# Patient Record
Sex: Male | Born: 1970
Health system: Southern US, Community
[De-identification: ages and names within clinical notes are randomized; demographics above are authoritative.]

## PROBLEM LIST (undated history)

## (undated) DIAGNOSIS — F329 Major depressive disorder, single episode, unspecified: Secondary | ICD-10-CM

## (undated) DIAGNOSIS — E785 Hyperlipidemia, unspecified: Secondary | ICD-10-CM

## (undated) DIAGNOSIS — I10 Essential (primary) hypertension: Secondary | ICD-10-CM

## (undated) DIAGNOSIS — Z8669 Personal history of other diseases of the nervous system and sense organs: Secondary | ICD-10-CM

## (undated) DIAGNOSIS — F32A Depression, unspecified: Secondary | ICD-10-CM

## (undated) DIAGNOSIS — F419 Anxiety disorder, unspecified: Secondary | ICD-10-CM

## (undated) DIAGNOSIS — K219 Gastro-esophageal reflux disease without esophagitis: Secondary | ICD-10-CM

## (undated) HISTORY — DX: Essential (primary) hypertension: I10

## (undated) HISTORY — DX: Personal history of other diseases of the nervous system and sense organs: Z86.69

## (undated) HISTORY — DX: Hyperlipidemia, unspecified: E78.5

## (undated) HISTORY — PX: ELBOW SURGERY: SHX618

## (undated) HISTORY — DX: Major depressive disorder, single episode, unspecified: F32.9

## (undated) HISTORY — DX: Depression, unspecified: F32.A

## (undated) HISTORY — DX: Gastro-esophageal reflux disease without esophagitis: K21.9

---

## 2011-04-26 DIAGNOSIS — K219 Gastro-esophageal reflux disease without esophagitis: Secondary | ICD-10-CM | POA: Insufficient documentation

## 2011-05-11 DIAGNOSIS — I10 Essential (primary) hypertension: Secondary | ICD-10-CM | POA: Insufficient documentation

## 2011-05-11 DIAGNOSIS — Z0000 Encounter for general adult medical examination without abnormal findings: Secondary | ICD-10-CM | POA: Insufficient documentation

## 2013-06-06 ENCOUNTER — Ambulatory Visit (INDEPENDENT_AMBULATORY_CARE_PROVIDER_SITE_OTHER): Payer: BC Managed Care – PPO | Admitting: Family

## 2013-06-06 ENCOUNTER — Encounter: Payer: Self-pay | Admitting: Family

## 2013-06-06 VITALS — BP 148/90 | HR 74 | Temp 98.4°F | Resp 16 | Ht 71.0 in | Wt 283.0 lb

## 2013-06-06 DIAGNOSIS — R635 Abnormal weight gain: Secondary | ICD-10-CM

## 2013-06-06 DIAGNOSIS — Z23 Encounter for immunization: Secondary | ICD-10-CM

## 2013-06-06 DIAGNOSIS — R21 Rash and other nonspecific skin eruption: Secondary | ICD-10-CM

## 2013-06-06 DIAGNOSIS — E785 Hyperlipidemia, unspecified: Secondary | ICD-10-CM

## 2013-06-06 DIAGNOSIS — I10 Essential (primary) hypertension: Secondary | ICD-10-CM

## 2013-06-06 DIAGNOSIS — L709 Acne, unspecified: Secondary | ICD-10-CM | POA: Insufficient documentation

## 2013-06-06 LAB — LIPID PANEL
HDL: 57 mg/dL (ref >39)
LDL Cholesterol: 125 mg/dL — ABNORMAL HIGH (ref 0–99)
Total CHOL/HDL Ratio: 3.5 Ratio
Triglycerides: 93 mg/dL (ref <150)

## 2013-06-06 LAB — BASIC METABOLIC PANEL WITH GFR
CO2: 25 mEq/L (ref 19–32)
Chloride: 103 mEq/L (ref 96–112)
Creat: 1.07 mg/dL (ref 0.50–1.35)
GFR, Est Non African American: 86 mL/min
Sodium: 138 mEq/L (ref 135–145)

## 2013-06-06 LAB — HEPATIC FUNCTION PANEL
AST: 29 U/L (ref 0–37)
Albumin: 4.6 g/dL (ref 3.5–5.2)
Bilirubin, Direct: 0.2 mg/dL (ref 0.0–0.3)
Indirect Bilirubin: 0.6 mg/dL (ref 0.0–0.9)
Total Bilirubin: 0.8 mg/dL (ref 0.3–1.2)
Total Protein: 7.3 g/dL (ref 6.0–8.3)

## 2013-06-06 LAB — TSH: TSH: 1.534 u[IU]/mL (ref 0.350–4.500)

## 2013-06-06 MED ORDER — AMLODIPINE BESYLATE 5 MG PO TABS: 5.0000 mg | ORAL_TABLET | Freq: Every day | ORAL | Status: DC

## 2013-06-06 NOTE — Assessment & Plan Note (Signed)
Will obtain flp/lft

## 2013-06-06 NOTE — Progress Notes (Signed)
Pre visit review using our clinic review tool, if applicable. No additional management support is needed unless otherwise documented below in the visit note. 

## 2013-06-06 NOTE — Assessment & Plan Note (Signed)
Obtain TSH, discussed healthy diet, exercise, weight loss.  Flu shot today.

## 2013-06-06 NOTE — Assessment & Plan Note (Signed)
Suspect rosacea, will refer to derm for management.

## 2013-06-06 NOTE — Patient Instructions (Signed)
Please complete lab work prior to leaving. Start amlodipine. Follow up in 1 month for blood pressure check. Start counting calories using my fitness pal with goal weight loss of 1-2 pounds a week. Try to walk 30 minutes 5 days a week. Work up slowly. Welcome to [facility]!

## 2013-06-06 NOTE — Progress Notes (Signed)
Subjective:    Patient ID: Chad Mejia, male    DOB: 1971/06/08, 42 y.o.   MRN: [id number]  HPI  Chad Mejia is a 42 yr old male who presents today to establish care.  HTN- had been on zestoretic but experienced ED.    Hypelipidemia- reports that he was told that cholesterol is elevated.  Migraines- reports he had them 1-2 times a year in his 20's, but has not had one in 10 years.  Reports some weight gain.  60 pounds in last 1.5 yrs. Reports more sweets in the last year.  Too much fast food, eats late.  Rare soda.     Review of Systems  Constitutional: Positive for unexpected weight change.  HENT: Negative for rhinorrhea.   Respiratory: Negative for cough and shortness of breath.   Cardiovascular: Negative for chest pain and leg swelling.  Gastrointestinal: Negative for diarrhea and constipation.       Occasional nausea with stress  Genitourinary: Negative for dysuria and frequency.  Musculoskeletal: Negative for arthralgias and myalgias.  Skin: Negative for rash.       Reports some facial rash.    Neurological: Negative for headaches.  Hematological: Negative for adenopathy.  Psychiatric/Behavioral:       Reports mild anxiety, no current depression   Past Medical History  Diagnosis Date  . Hyperlipidemia   . Hypertension   . History of migraine     History   Social History  . Marital Status: Single    Spouse Name: N/A    Number of Children: N/A  . Years of Education: N/A   Occupational History  . Not on file.   Social History Main Topics  . Smoking status: Never Smoker   . Smokeless tobacco: Never Used  . Alcohol Use: Yes     Comment: 1 beer a month  . Drug Use: Not on file  . Sexual Activity: Not on file   Other Topics Concern  . 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     Single, no children   Lives with friend   Has a cat   Enjoys painting,writing, reading   Bachelors degree    Past  Surgical History  Procedure Laterality Date  . Elbow surgery Left     "shattered elbow as a child"    Family History  Problem Relation Age of Onset  . Hyperlipidemia Mother   . Hypertension Mother   . Schizophrenia Maternal Aunt   . Arthritis Maternal Grandmother     No Known Allergies  No current outpatient prescriptions on file prior to visit.   No current facility-administered medications on file prior to visit.    BP 148/90  Pulse 74  Temp(Src) 98.4 F (36.9 C) (Oral)  Resp 16  Ht 5\' 11"  (1.803 m)  Wt 283 lb (128.368 kg)  BMI 39.49 kg/m2  SpO2 99%       Objective:   Physical Exam  Constitutional: He is oriented to person, place, and time. He appears well-developed.  Obese white male, pleasant, NAD  HENT:  Head: Normocephalic and atraumatic.  Cardiovascular: Normal rate and regular rhythm.   No murmur heard. Pulmonary/Chest: Effort normal and breath sounds normal. No respiratory distress. He has no wheezes. He has no rales. He exhibits no tenderness.  Abdominal: Soft. Bowel sounds are normal. He exhibits no distension. There is no tenderness. There is no rebound and no guarding.  Lymphadenopathy:  He has no cervical adenopathy.  Neurological: He is alert and oriented to person, place, and time.  Skin: Skin is warm and dry.  + erythematous rash noted on face  Psychiatric: He has a normal mood and affect. His behavior is normal. Judgment and thought content normal.          Assessment & Plan:

## 2013-06-06 NOTE — Assessment & Plan Note (Signed)
Start amlodipine

## 2013-06-07 ENCOUNTER — Encounter: Payer: Self-pay | Admitting: Family

## 2013-07-10 ENCOUNTER — Ambulatory Visit (INDEPENDENT_AMBULATORY_CARE_PROVIDER_SITE_OTHER): Payer: BC Managed Care – PPO | Admitting: Family

## 2013-07-10 ENCOUNTER — Encounter: Payer: Self-pay | Admitting: Family

## 2013-07-10 VITALS — BP 142/90 | HR 60 | Temp 97.8°F | Resp 16 | Ht 71.0 in | Wt 286.0 lb

## 2013-07-10 DIAGNOSIS — I10 Essential (primary) hypertension: Secondary | ICD-10-CM

## 2013-07-10 DIAGNOSIS — R635 Abnormal weight gain: Secondary | ICD-10-CM

## 2013-07-10 MED ORDER — AMLODIPINE BESYLATE 10 MG PO TABS: 10.0000 mg | ORAL_TABLET | Freq: Every day | ORAL | Status: DC

## 2013-07-10 NOTE — Patient Instructions (Signed)
Start taking 2 tablets by mouth daily, then fill Norvasc 10mg  1 tablet daily. Follow up in one month. Start checking Blood Pressure daily at home.

## 2013-07-10 NOTE — Assessment & Plan Note (Signed)
Discussed healthy diet and exercise

## 2013-07-10 NOTE — Progress Notes (Signed)
Pre visit review using our clinic review tool, if applicable. No additional management support is needed unless otherwise documented below in the visit note. 

## 2013-07-10 NOTE — Assessment & Plan Note (Addendum)
Uncontrolled. Increase Amlodipine to 10mg  daily due continued elevated BP. Follow up in one month. Discussed importance of diet and exercise.  I have personally seen and examined patient and agree with Jerrel Ivory NP student's assessment and plan.

## 2013-07-10 NOTE — Progress Notes (Signed)
   Subjective:    Patient ID: Chad Mejia, male    DOB: 04-17-1971, 43 y.o.   MRN: [id number]  Hypertension Pertinent negatives include no chest pain, headaches or shortness of breath.   Chad Mejia is a 43 year old male who presents today for follow up of hypertension.  Patient was started on Amlodipine 5mg  during last visit and BP today was 142/90.  Patient reports taking medication daily, denies chest pain, lower extremity edema, and shortness of breath. Patient reports checking BP during the week and running 130's/90's.  Patient reports he's starting to work on diet and exercise.   Review of Systems  Constitutional: Negative for chills.  HENT: Negative for rhinorrhea.   Respiratory: Negative for cough and shortness of breath.   Cardiovascular: Negative for chest pain and leg swelling.  Genitourinary: Negative for difficulty urinating.  Neurological: Negative for dizziness and headaches.   Past Medical History  Diagnosis Date  . Hyperlipidemia   . Hypertension   . History of migraine     History   Social History  . Marital Status: Single    Spouse Name: N/A    Number of Children: N/A  . Years of Education: N/A   Occupational History  . Not on file.   Social History Main Topics  . Smoking status: Passive Smoke Exposure - Never Smoker  . Smokeless tobacco: Never Used  . Alcohol Use: Yes     Comment: 1 beer a month  . Drug Use: Not on file  . Sexual Activity: Not on file   Other Topics Concern  . 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     Single, no children   Lives with friend   Has a cat   Enjoys painting,writing, reading   Bachelors degree    Past Surgical History  Procedure Laterality Date  . Elbow surgery Left     "shattered elbow as a child"    Family History  Problem Relation Age of Onset  . Hyperlipidemia Mother   . Hypertension Mother   . Schizophrenia Maternal Aunt   . Arthritis Maternal  Grandmother     No Known Allergies  No current outpatient prescriptions on file prior to visit.   No current facility-administered medications on file prior to visit.    BP 142/90  Pulse 60  Temp(Src) 97.8 F (36.6 C) (Oral)  Resp 16  Ht 5\' 11"  (1.803 m)  Wt 286 lb (129.729 kg)  BMI 39.91 kg/m2  SpO2 99%        Objective:   Physical Exam  Constitutional: He is oriented to person, place, and time. He appears well-nourished.  HENT:  Head: Normocephalic.  Cardiovascular: Normal rate, regular rhythm and normal heart sounds.   Pulmonary/Chest: Breath sounds normal. No respiratory distress. He has no wheezes.  Neurological: He is alert and oriented to person, place, and time.  Skin: Skin is warm and dry.  Psychiatric: He has a normal mood and affect.          Assessment & Plan:

## 2013-07-19 ENCOUNTER — Telehealth: Payer: Self-pay | Admitting: Family

## 2013-07-19 NOTE — Telephone Encounter (Signed)
Relevant patient education mailed to patient.  

## 2013-08-07 ENCOUNTER — Ambulatory Visit (INDEPENDENT_AMBULATORY_CARE_PROVIDER_SITE_OTHER): Payer: BC Managed Care – PPO | Admitting: Family

## 2013-08-07 ENCOUNTER — Encounter: Payer: Self-pay | Admitting: Family

## 2013-08-07 VITALS — BP 138/90 | HR 64 | Temp 98.7°F | Resp 16 | Ht 71.0 in | Wt 280.1 lb

## 2013-08-07 DIAGNOSIS — I10 Essential (primary) hypertension: Secondary | ICD-10-CM

## 2013-08-07 MED ORDER — LISINOPRIL 10 MG PO TABS
10.0000 mg | ORAL_TABLET | Freq: Every day | ORAL | Status: DC
Start: 2013-08-07 — End: 2013-09-04

## 2013-08-07 NOTE — Progress Notes (Signed)
Subjective:    Patient ID: Chad Mejia, male    DOB: 12-30-1970, 43 y.o.   MRN: [id number]  Hypertension Associated symptoms include headaches. Pertinent negatives include no chest pain, palpitations or shortness of breath.   Chad Mejia is a 43 year old male who presents today for follow up of hypertension. Patient was started on Amlodipine 5mg  for hypertension in December 2014 which was increased to10mg  daily in January. Patient reports taking his blood pressure at home and has been running 025'K to 270'W systolic and 23'J to 62'G diastolic. More than 50% of his DBP's at home have been >90.  Patient denies regular exercise and healthy diet at this point. Denies chest pain, shortness of breath, palpitations.   BP Readings from Last 3 Encounters:  08/07/13 138/90  07/10/13 142/90  06/06/13 148/90     Review of Systems  Constitutional: Negative for chills.  HENT: Negative for sore throat.   Respiratory: Negative for cough, chest tightness and shortness of breath.   Cardiovascular: Negative for chest pain, palpitations and leg swelling.  Neurological: Positive for headaches. Negative for dizziness.       Past Medical History  Diagnosis Date  . Hyperlipidemia   . Hypertension   . History of migraine     History   Social History  . Marital Status: Single    Spouse Name: N/A    Number of Children: N/A  . Years of Education: N/A   Occupational History  . Not on file.   Social History Main Topics  . Smoking status: Passive Smoke Exposure - Never Smoker  . Smokeless tobacco: Never Used  . Alcohol Use: Yes     Comment: 1 beer a month  . Drug Use: Not on file  . Sexual Activity: Not on file   Other Topics Concern  . 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     Single, no children   Lives with friend   Has a cat   Enjoys painting,writing, reading   Bachelors degree    Past Surgical History  Procedure Laterality  Date  . Elbow surgery Left     "shattered elbow as a child"    Family History  Problem Relation Age of Onset  . Hyperlipidemia Mother   . Hypertension Mother   . Schizophrenia Maternal Aunt   . Arthritis Maternal Grandmother     No Known Allergies  Current Outpatient Prescriptions on File Prior to Visit  Medication Sig Dispense Refill  . amLODipine (NORVASC) 10 MG tablet Take 1 tablet (10 mg total) by mouth daily.  30 tablet  2   No current facility-administered medications on file prior to visit.    BP 138/90  Pulse 64  Temp(Src) 98.7 F (37.1 C) (Oral)  Resp 16  Ht 5\' 11"  (1.803 m)  Wt 280 lb 1.9 oz (127.062 kg)  BMI 39.09 kg/m2  SpO2 99%    Objective:   Physical Exam  Constitutional: He is oriented to person, place, and time. He appears well-nourished.  HENT:  Head: Normocephalic.  Cardiovascular: Normal rate, regular rhythm, S1 normal, S2 normal and normal heart sounds.   No murmur heard. Pulses:      Radial pulses are 2+ on the right side, and 2+ on the left side.  Pulmonary/Chest: Effort normal and breath sounds normal. He has no wheezes.  Neurological: He is alert and oriented to person, place, and time.  Skin: Skin is warm and  dry.  Psychiatric: He has a normal mood and affect.          Assessment & Plan:  I have personally seen and examined patient and agree with Alma Friendly NP-student's assessment and plan.

## 2013-08-07 NOTE — Patient Instructions (Signed)
Start lisinopril, follow up in 1 month.

## 2013-08-07 NOTE — Assessment & Plan Note (Signed)
Continues to be uncontrolled, start lisinopril 10mg  daily, continue amlodipine 10mg  daily. Follow up in one month. Will obtain BMET then.

## 2013-08-07 NOTE — Progress Notes (Signed)
Pre visit review using our clinic review tool, if applicable. No additional management support is needed unless otherwise documented below in the visit note. 

## 2013-08-08 ENCOUNTER — Telehealth: Payer: Self-pay | Admitting: Family

## 2013-08-08 NOTE — Telephone Encounter (Signed)
Relevant patient education assigned to patient using Emmi. ° °

## 2013-09-04 ENCOUNTER — Ambulatory Visit (INDEPENDENT_AMBULATORY_CARE_PROVIDER_SITE_OTHER): Payer: BC Managed Care – PPO | Admitting: Family

## 2013-09-04 ENCOUNTER — Encounter: Payer: Self-pay | Admitting: Family

## 2013-09-04 VITALS — BP 130/76 | HR 69 | Temp 98.1°F | Resp 16 | Ht 71.0 in | Wt 281.1 lb

## 2013-09-04 DIAGNOSIS — I10 Essential (primary) hypertension: Secondary | ICD-10-CM

## 2013-09-04 LAB — BASIC METABOLIC PANEL
BUN: 13 mg/dL (ref 6–23)
CHLORIDE: 101 meq/L (ref 96–112)
CO2: 28 mEq/L (ref 19–32)
Calcium: 9.5 mg/dL (ref 8.4–10.5)
Creat: 0.97 mg/dL (ref 0.50–1.35)
Glucose, Bld: 98 mg/dL (ref 70–99)
Potassium: 4.5 mEq/L (ref 3.5–5.3)
SODIUM: 136 meq/L (ref 135–145)

## 2013-09-04 MED ORDER — AMLODIPINE BESYLATE 10 MG PO TABS: 10.0000 mg | ORAL_TABLET | Freq: Every day | ORAL | Status: DC

## 2013-09-04 MED ORDER — LISINOPRIL 10 MG PO TABS: 10.0000 mg | ORAL_TABLET | Freq: Every day | ORAL | Status: DC

## 2013-09-04 NOTE — Progress Notes (Signed)
   Subjective:    Patient ID: Chad Mejia, male    DOB: 1970-08-02, 43 y.o.   MRN: [id number]  HPI  Chad Mejia is a 43 yr old male who presents today for follow up of hypertension.    Current medications include amlodipine 10mg  and lisinopril 10 mg. Lisinopril was added to his regimen last visit. Denies cough, sob, swelling or chest pain.  He has been monitoring his blood pressure at home closely and his readings have been well controlled.   BP Readings from Last 3 Encounters:  09/04/13 130/76  08/07/13 138/90  07/10/13 142/90     Review of Systems See HPI  Past Medical History  Diagnosis Date  . Hyperlipidemia   . Hypertension   . History of migraine     History   Social History  . Marital Status: Single    Spouse Name: N/A    Number of Children: N/A  . Years of Education: N/A   Occupational History  . Not on file.   Social History Main Topics  . Smoking status: Passive Smoke Exposure - Never Smoker  . Smokeless tobacco: Never Used  . Alcohol Use: Yes     Comment: 1 beer a month  . Drug Use: Not on file  . Sexual Activity: Not on file   Other Topics Concern  . 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     Single, no children   Lives with friend   Has a cat   Enjoys painting,writing, reading   Bachelors degree    Past Surgical History  Procedure Laterality Date  . Elbow surgery Left     "shattered elbow as a child"    Family History  Problem Relation Age of Onset  . Hyperlipidemia Mother   . Hypertension Mother   . Schizophrenia Maternal Aunt   . Arthritis Maternal Grandmother     No Known Allergies  Current Outpatient Prescriptions on File Prior to Visit  Medication Sig Dispense Refill  . amLODipine (NORVASC) 10 MG tablet Take 1 tablet (10 mg total) by mouth daily.  30 tablet  2  . lisinopril (PRINIVIL,ZESTRIL) 10 MG tablet Take 1 tablet (10 mg total) by mouth daily.  30 tablet  0   No  current facility-administered medications on file prior to visit.    BP 130/76  Pulse 69  Temp(Src) 98.1 F (36.7 C) (Oral)  Resp 16  Ht 5\' 11"  (1.803 m)  Wt 281 lb 1.3 oz (127.497 kg)  BMI 39.22 kg/m2  SpO2 99%       Objective:   Physical Exam  Constitutional: He is oriented to person, place, and time. He appears well-developed and well-nourished. No distress.  HENT:  Head: Normocephalic and atraumatic.  Cardiovascular: Normal rate and regular rhythm.   No murmur heard. Pulmonary/Chest: Effort normal and breath sounds normal. No respiratory distress. He has no wheezes. He has no rales. He exhibits no tenderness.  Musculoskeletal: He exhibits no edema.  Neurological: He is alert and oriented to person, place, and time.  Psychiatric: He has a normal mood and affect. His behavior is normal. Judgment and thought content normal.          Assessment & Plan:

## 2013-09-04 NOTE — Progress Notes (Signed)
Pre visit review using our clinic review tool, if applicable. No additional management support is needed unless otherwise documented below in the visit note. 

## 2013-09-04 NOTE — Patient Instructions (Signed)
Please complete lab work prior to leaving. Follow up in 4 months.  

## 2013-09-04 NOTE — Assessment & Plan Note (Signed)
Improved.  Continue current meds. Check follow up BMET today. He plans to start walking now that the weather is getting nicer.  I have encouraged him to do this. He will continue to monitor his blood pressures at home and will contact me if he develops elevated pressures.  Otherwise, we will see him back in 4 months.

## 2014-01-01 ENCOUNTER — Encounter: Payer: Self-pay | Admitting: Family

## 2014-01-01 ENCOUNTER — Ambulatory Visit (INDEPENDENT_AMBULATORY_CARE_PROVIDER_SITE_OTHER): Payer: BC Managed Care – PPO | Admitting: Family

## 2014-01-01 VITALS — BP 112/78 | HR 60 | Temp 97.9°F | Resp 16 | Ht 71.0 in | Wt 278.1 lb

## 2014-01-01 DIAGNOSIS — R400 Somnolence: Secondary | ICD-10-CM | POA: Insufficient documentation

## 2014-01-01 DIAGNOSIS — G4710 Hypersomnia, unspecified: Secondary | ICD-10-CM

## 2014-01-01 DIAGNOSIS — I10 Essential (primary) hypertension: Secondary | ICD-10-CM

## 2014-01-01 DIAGNOSIS — R404 Transient alteration of awareness: Secondary | ICD-10-CM

## 2014-01-01 MED ORDER — ZOLPIDEM TARTRATE 5 MG PO TABS: 5.0000 mg | ORAL_TABLET | Freq: Every evening | ORAL | Status: DC | PRN

## 2014-01-01 NOTE — Patient Instructions (Addendum)
Please follow up in 4 months. Start [place] on an as needed basis.

## 2014-01-01 NOTE — Progress Notes (Addendum)
Subjective:    Patient ID: Chad Mejia, male    DOB: 06/25/1970, 43 y.o.   MRN: [id number]  HPI Mr. Stefanelli is a 43 yo male here today for fu for HTN. He was started on Amlodipine 5mg  for hypertension in December 2014 which was increased to10mg  daily in January. Lisinopril 10 mg was added February 2015.  HYPERTENSION: Monitors BP at home: yes Blood pressure range/ average : systolic ranges 144-315 and diastolic ranges 40-08. Medication Compliance: yes Exercise: twice weekly walks for 30-60 minutes. Chest pain, palpitations:      no Dyspnea: no Edema: no Lightheadedness,Syncope:no BP Readings from Last 3 Encounters:  01/01/14 112/78  09/04/13 130/76  08/07/13 138/90    Complains of insomnia. He has trouble falling asleep. Sleeps about 4 hours per night. He has had this his whole life. + snoring, + daytime sleepiness.  Non-restorative slep  Review of Systems  Constitutional: Negative for fever, chills and diaphoresis.  Respiratory: Negative for cough.   Cardiovascular: Negative for chest pain and palpitations.  Neurological: Negative for dizziness and light-headedness.   Past Medical History  Diagnosis Date   Hyperlipidemia    Hypertension    History of migraine     History   Social History   Marital Status: Single    Spouse Name: N/A    Number of Children: N/A   Years of Education: N/A   Occupational History   Not on file.   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     Single, no children   Lives with friend   Has a cat   Enjoys painting,writing, reading   Bachelors degree    Past Surgical History  Procedure Laterality Date   Elbow surgery Left       "shattered elbow as a child"    Family History  Problem Relation Age of Onset   Hyperlipidemia Mother    Hypertension Mother    Schizophrenia Maternal Aunt    Arthritis Maternal Grandmother     No Known Allergies  Current Outpatient Prescriptions on File Prior to Visit  Medication Sig Dispense Refill   amLODipine (NORVASC) 10 MG tablet Take 1 tablet (10 mg total) by mouth daily.  90 tablet  1   lisinopril (PRINIVIL,ZESTRIL) 10 MG tablet Take 1 tablet (10 mg total) by mouth daily.  90 tablet  1   No current facility-administered medications on file prior to visit.    BP 112/78   Pulse 60   Temp(Src) 97.9 F (36.6 C) (Oral)   Resp 16   Ht 5\' 11"  (1.803 m)   Wt 278 lb 1.9 oz (126.154 kg)   BMI 38.81 kg/m2       Objective:   Physical Exam  Constitutional: He is oriented to person, place, and time. He appears well-developed and well-nourished. No distress.  HENT:  Head: Normocephalic and atraumatic.  Mouth/Throat: No oropharyngeal exudate.  Eyes: Pupils are equal, round, and reactive to light. No scleral icterus.  Cardiovascular: Normal rate and regular rhythm.  Exam reveals no gallop and no friction rub.   No murmur heard. Pulmonary/Chest: Effort normal and breath sounds  normal. No respiratory distress. He has no wheezes. He has no rales.  Musculoskeletal: He exhibits no edema.  Lymphadenopathy:    He has no cervical adenopathy.  Neurological: He is alert and oriented to person, place, and time.  Skin: Skin is warm and dry. He is not diaphoretic.  Psychiatric: He has a normal mood and affect. His behavior is normal.          Assessment & Plan:  Patient seen by [facility] NP-student.  I have personally seen and examined patient and agree with Ms. Whitmire's assessment and plan.

## 2014-01-01 NOTE — Progress Notes (Signed)
Pre visit review using our clinic review tool, if applicable. No additional management support is needed unless otherwise documented below in the visit note. 

## 2014-01-01 NOTE — Assessment & Plan Note (Signed)
Body mass index is 38.81 kg/(m^2). Given hx and habitus, I am suspicious for OSA. Will refer for sleep study.  I would like to evaluate him for OSA prior to adding a sedating med such as ambien.  He is agreeable.

## 2014-01-01 NOTE — Assessment & Plan Note (Signed)
BP stable on current meds.   

## 2014-02-26 ENCOUNTER — Encounter (HOSPITAL_BASED_OUTPATIENT_CLINIC_OR_DEPARTMENT_OTHER): Payer: BC Managed Care – PPO

## 2014-02-28 ENCOUNTER — Other Ambulatory Visit: Payer: Self-pay | Admitting: Family

## 2014-03-01 ENCOUNTER — Other Ambulatory Visit: Payer: Self-pay | Admitting: Family

## 2014-04-17 ENCOUNTER — Telehealth: Payer: Self-pay | Admitting: *Deleted

## 2014-04-17 MED ORDER — AMLODIPINE BESYLATE 10 MG PO TABS: 10.0000 mg | ORAL_TABLET | Freq: Every day | ORAL | Status: DC

## 2014-04-17 NOTE — Telephone Encounter (Signed)
Refill request from [facility] for amlodipine. Rx sent, #90 x no refills.

## 2014-04-18 ENCOUNTER — Ambulatory Visit (HOSPITAL_BASED_OUTPATIENT_CLINIC_OR_DEPARTMENT_OTHER): Payer: BC Managed Care – PPO

## 2014-04-23 ENCOUNTER — Emergency Department (HOSPITAL_COMMUNITY)
Admission: EM | Admit: 2014-04-23 | Discharge: 2014-04-23 | Disposition: A | Discharge status: Home / Self Care | Payer: BC Managed Care – PPO | Attending: Emergency Medicine | Admitting: Emergency Medicine

## 2014-04-23 ENCOUNTER — Emergency Department (HOSPITAL_COMMUNITY): Payer: BC Managed Care – PPO

## 2014-04-23 ENCOUNTER — Encounter (HOSPITAL_COMMUNITY): Payer: Self-pay | Admitting: Emergency Medicine

## 2014-04-23 DIAGNOSIS — G43909 Migraine, unspecified, not intractable, without status migrainosus: Secondary | ICD-10-CM | POA: Insufficient documentation

## 2014-04-23 DIAGNOSIS — S9031XA Contusion of right foot, initial encounter: Secondary | ICD-10-CM | POA: Insufficient documentation

## 2014-04-23 DIAGNOSIS — S61211A Laceration without foreign body of left index finger without damage to nail, initial encounter: Secondary | ICD-10-CM | POA: Diagnosis not present

## 2014-04-23 DIAGNOSIS — W260XXA Contact with knife, initial encounter: Secondary | ICD-10-CM | POA: Insufficient documentation

## 2014-04-23 DIAGNOSIS — Z8639 Personal history of other endocrine, nutritional and metabolic disease: Secondary | ICD-10-CM | POA: Insufficient documentation

## 2014-04-23 DIAGNOSIS — Y9289 Other specified places as the place of occurrence of the external cause: Secondary | ICD-10-CM | POA: Insufficient documentation

## 2014-04-23 DIAGNOSIS — Y998 Other external cause status: Secondary | ICD-10-CM | POA: Insufficient documentation

## 2014-04-23 DIAGNOSIS — I10 Essential (primary) hypertension: Secondary | ICD-10-CM | POA: Insufficient documentation

## 2014-04-23 DIAGNOSIS — Z79899 Other long term (current) drug therapy: Secondary | ICD-10-CM | POA: Insufficient documentation

## 2014-04-23 DIAGNOSIS — S99921A Unspecified injury of right foot, initial encounter: Secondary | ICD-10-CM | POA: Diagnosis present

## 2014-04-23 DIAGNOSIS — S61219A Laceration without foreign body of unspecified finger without damage to nail, initial encounter: Secondary | ICD-10-CM

## 2014-04-23 DIAGNOSIS — W2209XA Striking against other stationary object, initial encounter: Secondary | ICD-10-CM | POA: Diagnosis not present

## 2014-04-23 DIAGNOSIS — Y9389 Activity, other specified: Secondary | ICD-10-CM | POA: Diagnosis not present

## 2014-04-23 NOTE — ED Provider Notes (Signed)
CSN: [id number]     Arrival date & time 04/23/14  1926 History   First MD Initiated Contact with Patient 04/23/14 2047     Chief Complaint  Patient presents with  . Foot Injury     (Consider location/radiation/quality/duration/timing/severity/associated sxs/prior Treatment) HPI   Chad Mejia is a 43 y.o. male  Who injured her right foot when he kicked a cabinet, because he was angry. Prior to that he cut the index finger of the left hand, on a sharp knife. Incident occurred several hours ago.  He has persistent pain with walking, so came here for evaluation.  He has had a tetanus shot within the last 5 years.  He has previously broken the right foot.  There are no other known modifying factors.  Past Medical History  Diagnosis Date  . Hyperlipidemia   . Hypertension   . History of migraine    Past Surgical History  Procedure Laterality Date  . Elbow surgery Left     "shattered elbow as a child"   Family History  Problem Relation Age of Onset  . Hyperlipidemia Mother   . Hypertension Mother   . Schizophrenia Maternal Aunt   . Arthritis Maternal Grandmother    History  Substance Use Topics  . Smoking status: Passive Smoke Exposure - Never Smoker  . Smokeless tobacco: Never Used  . Alcohol Use: Yes     Comment: 1 beer a month    Review of Systems  All other systems reviewed and are negative.     Allergies  Review of patient's allergies indicates no known allergies.  Home Medications   Prior to Admission medications   Medication Sig Start Date End Date Taking? Authorizing Provider  amLODipine (NORVASC) 10 MG tablet Take 1 tablet (10 mg total) by mouth daily. 04/17/14  Yes Debbrah Alar, NP  lisinopril (PRINIVIL,ZESTRIL) 10 MG tablet Take 10 mg by mouth daily.   Yes Historical Provider, MD   BP 146/72 mmHg  Pulse 94  Temp(Src) 98.6 F (37 C) (Oral)  Resp 18  Ht 5\' 11"  (1.803 m)  Wt 275 lb (124.739 kg)  BMI 38.37 kg/m2  SpO2 99% Physical Exam   Constitutional: He is oriented to person, place, and time. He appears well-developed and well-nourished.  HENT:  Head: Normocephalic and atraumatic.  Right Ear: External ear normal.  Left Ear: External ear normal.  Eyes: Conjunctivae and EOM are normal. Pupils are equal, round, and reactive to light.  Neck: Normal range of motion and phonation normal. Neck supple.  Cardiovascular: Normal rate, regular rhythm and normal heart sounds.   Pulmonary/Chest: Effort normal and breath sounds normal. He exhibits no bony tenderness.  Abdominal: Soft. There is no tenderness.  Musculoskeletal: Normal range of motion.  Left hand, finger to with superficial laceration over the dorsal distal finger, proximal to the nail, not bleeding; normal range of motion of this finger. Right foot, tender mid aspect, dorsally, with normal range of motion but pain with palpation.  No tenderness of the hindfoot or ankle.  No toe deformity or tenderness.  Neurological: He is alert and oriented to person, place, and time. No cranial nerve deficit or sensory deficit. He exhibits normal muscle tone. Coordination normal.  Skin: Skin is warm, dry and intact.  Psychiatric: He has a normal mood and affect. His behavior is normal. Judgment and thought content normal.  Nursing note and vitals reviewed.   ED Course  Procedures (including critical care time)  Wound care- Left index finger laceration, with  soap and water, and Band-Aid applied.  Medications - No data to display  Patient Vitals for the past 24 hrs:  BP Temp Temp src Pulse Resp SpO2 Height Weight  04/23/14 1953 146/72 mmHg 98.6 F (37 C) Oral 94 18 99 % 5\' 11"  (1.803 m) 275 lb (124.739 kg)    10:08 PM Reevaluation with update and discussion. After initial assessment and treatment, an updated evaluation reveals no further complaints, findings discussed with patient, questions answered. [facility] Review Labs Reviewed - No data to display  Imaging  Review Dg Foot Complete Right  04/23/2014   CLINICAL DATA:  Great toe injury.  Acute trauma.  RIGHT foot pain.  EXAM: RIGHT FOOT COMPLETE - 3+ VIEW  COMPARISON:  None.  FINDINGS: The alignment of the bones of the RIGHT foot is within normal limits. No acute fracture is identified. Small well corticated avulsion fracture fragments present adjacent to the lateral aspect of the first MTP joint.  IMPRESSION: No acute osseous injury.   Electronically Signed   By: Dereck Ligas M.D.   On: 04/23/2014 21:37     EKG Interpretation None      MDM   Final diagnoses:  Contusion, foot, right, initial encounter  Finger laceration, initial encounter    Minor finger laceration requiring closure.  Contusion foot without acute fracture.   Nursing Notes Reviewed/ Care Coordinated Applicable Imaging Reviewed Interpretation of Laboratory Data incorporated into ED treatment  The patient appears reasonably screened and/or stabilized for discharge and I doubt any other medical condition or other [facility] requiring further screening, evaluation, or treatment in the ED at this time prior to discharge.  Plan: Home Medications- IBU prn; Home Treatments- rest, elevation; return here if the recommended treatment, does not improve the symptoms; Recommended follow up- PCP prn   Richarda Blade, MD 04/23/14 2210

## 2014-04-23 NOTE — Discharge Instructions (Signed)
Keep the right foot elevated above your heart, as much as possible for 2 or 3 days. Take ibuprofen, 400 mg 3 times a day for pain. Keep the finger wound clean with soap and water twice a day and apply a Band-Aid, until healed.   Contusion A contusion is a deep bruise. Contusions are the result of an injury that caused bleeding under the skin. The contusion may turn blue, purple, or yellow. Minor injuries will give you a painless contusion, but more severe contusions may stay painful and swollen for a few weeks.  CAUSES  A contusion is usually caused by a blow, trauma, or direct force to an area of the body. SYMPTOMS   Swelling and redness of the injured area.  Bruising of the injured area.  Tenderness and soreness of the injured area.  Pain. DIAGNOSIS  The diagnosis can be made by taking a history and physical exam. An X-ray, CT scan, or MRI may be needed to determine if there were any associated injuries, such as fractures. TREATMENT  Specific treatment will depend on what area of the body was injured. In general, the best treatment for a contusion is resting, icing, elevating, and applying cold compresses to the injured area. Over-the-counter medicines may also be recommended for pain control. Ask your caregiver what the best treatment is for your contusion. HOME CARE INSTRUCTIONS   Put ice on the injured area.  Put ice in a plastic bag.  Place a towel between your skin and the bag.  Leave the ice on for 15-20 minutes, 3-4 times a day, or as directed by your health care provider.  Only take over-the-counter or prescription medicines for pain, discomfort, or fever as directed by your caregiver. Your caregiver may recommend avoiding anti-inflammatory medicines (aspirin, ibuprofen, and naproxen) for 48 hours because these medicines may increase bruising.  Rest the injured area.  If possible, elevate the injured area to reduce swelling. SEEK IMMEDIATE MEDICAL CARE IF:   You have  increased bruising or swelling.  You have pain that is getting worse.  Your swelling or pain is not relieved with medicines. MAKE SURE YOU:   Understand these instructions.  Will watch your condition.  Will get help right away if you are not doing well or get worse. Document Released: 03/11/2005 Document Revised: 06/06/2013 Document Reviewed: 04/06/2011 [facility] Patient Information 2015 [place], [place]. This information is not intended to replace advice given to you by your health care provider. Make sure you discuss any questions you have with your health care provider.  Laceration Care, Adult A laceration is a cut that goes through all layers of the skin. The cut goes into the tissue beneath the skin. HOME CARE For stitches (sutures) or staples:  Keep the cut clean and dry.  If you have a bandage (dressing), change it at least once a day. Change the bandage if it gets wet or dirty, or as told by your doctor.  Wash the cut with soap and water 2 times a day. Rinse the cut with water. Pat it dry with a clean towel.  Put a thin layer of medicated cream on the cut as told by your doctor.  You may shower after the first 24 hours. Do not soak the cut in water until the stitches are removed.  Only take medicines as told by your doctor.  Have your stitches or staples removed as told by your doctor. For skin adhesive strips:  Keep the cut clean and dry.  Do not  get the strips wet. You may take a bath, but be careful to keep the cut dry.  If the cut gets wet, pat it dry with a clean towel.  The strips will fall off on their own. Do not remove the strips that are still stuck to the cut. For wound glue:  You may shower or take baths. Do not soak or scrub the cut. Do not swim. Avoid heavy sweating until the glue falls off on its own. After a shower or bath, pat the cut dry with a clean towel.  Do not put medicine on your cut until the glue falls off.  If you have a bandage, do not  put tape over the glue.  Avoid lots of sunlight or tanning lamps until the glue falls off. Put sunscreen on the cut for the first year to reduce your scar.  The glue will fall off on its own. Do not pick at the glue. You may need a tetanus shot if:  You cannot remember when you had your last tetanus shot.  You have never had a tetanus shot. If you need a tetanus shot and you choose not to have one, you may get tetanus. Sickness from tetanus can be serious. GET HELP RIGHT AWAY IF:   Your pain does not get better with medicine.  Your arm, hand, leg, or foot loses feeling (numbness) or changes color.  Your cut is bleeding.  Your joint feels weak, or you cannot use your joint.  You have painful lumps on your body.  Your cut is red, puffy (swollen), or painful.  You have a red line on the skin near the cut.  You have yellowish-white fluid (pus) coming from the cut.  You have a fever.  You have a bad smell coming from the cut or bandage.  Your cut breaks open before or after stitches are removed.  You notice something coming out of the cut, such as wood or glass.  You cannot move a finger or toe. MAKE SURE YOU:   Understand these instructions.  Will watch your condition.  Will get help right away if you are not doing well or get worse. Document Released: 11/18/2007 Document Revised: 08/24/2011 Document Reviewed: 11/25/2010 [facility] Patient Information 2015 [place], [place]. This information is not intended to replace advice given to you by your health care provider. Make sure you discuss any questions you have with your health care provider.

## 2014-04-23 NOTE — ED Notes (Signed)
Pt states he cut his finger at work and when he went to get a bandaid he stubbed his toe on the desk and it made him mad so he kicked the desk  Pt states he has pain in his right foot and it hurts to bear weight on it   Pt is c/o pain to the top of his foot

## 2014-04-30 ENCOUNTER — Ambulatory Visit (INDEPENDENT_AMBULATORY_CARE_PROVIDER_SITE_OTHER): Payer: BC Managed Care – PPO | Admitting: *Deleted

## 2014-04-30 ENCOUNTER — Ambulatory Visit (INDEPENDENT_AMBULATORY_CARE_PROVIDER_SITE_OTHER): Payer: BC Managed Care – PPO | Admitting: Family

## 2014-04-30 ENCOUNTER — Encounter: Payer: Self-pay | Admitting: Family

## 2014-04-30 VITALS — BP 120/82 | HR 65 | Temp 98.2°F | Resp 16 | Ht 71.0 in | Wt 282.6 lb

## 2014-04-30 DIAGNOSIS — Z23 Encounter for immunization: Secondary | ICD-10-CM

## 2014-04-30 DIAGNOSIS — E785 Hyperlipidemia, unspecified: Secondary | ICD-10-CM

## 2014-04-30 DIAGNOSIS — I10 Essential (primary) hypertension: Secondary | ICD-10-CM

## 2014-04-30 LAB — BASIC METABOLIC PANEL WITH GFR
BUN: 13 mg/dL (ref 6–23)
CO2: 22 meq/L (ref 19–32)
Calcium: 9.5 mg/dL (ref 8.4–10.5)
Chloride: 107 meq/L (ref 96–112)
Creatinine, Ser: 1.1 mg/dL (ref 0.4–1.5)
GFR: 81.27 mL/min
Glucose, Bld: 101 mg/dL — ABNORMAL HIGH (ref 70–99)
Potassium: 4.1 meq/L (ref 3.5–5.1)
Sodium: 139 meq/L (ref 135–145)

## 2014-04-30 LAB — LIPID PANEL
CHOLESTEROL: 209 mg/dL — AB (ref 0–200)
HDL: 42.9 mg/dL (ref >39.00)
LDL CALC: 149 mg/dL — AB (ref 0–99)
NonHDL: 166.1
Total CHOL/HDL Ratio: 5
Triglycerides: 88 mg/dL (ref 0.0–149.0)
VLDL: 17.6 mg/dL (ref 0.0–40.0)

## 2014-04-30 MED ORDER — AMLODIPINE BESYLATE 10 MG PO TABS
10.0000 mg | ORAL_TABLET | Freq: Every day | ORAL | Status: DC
Start: 2014-04-30 — End: 2014-10-13

## 2014-04-30 MED ORDER — LISINOPRIL 10 MG PO TABS: 10.0000 mg | ORAL_TABLET | Freq: Every day | ORAL | Status: DC

## 2014-04-30 NOTE — Assessment & Plan Note (Signed)
Fasting today, obtain follow up flp.

## 2014-04-30 NOTE — Progress Notes (Signed)
Pre visit review using our clinic review tool, if applicable. No additional management support is needed unless otherwise documented below in the visit note. 

## 2014-04-30 NOTE — Assessment & Plan Note (Signed)
We discussed importance of weight loss.

## 2014-04-30 NOTE — Patient Instructions (Signed)
Please complete lab work prior to leaving.  Follow up in 6 months.  

## 2014-04-30 NOTE — Assessment & Plan Note (Signed)
Stable on current meds. Obtain follow up bmet.  

## 2014-04-30 NOTE — Progress Notes (Signed)
   Subjective:    Patient ID: Chad Mejia, male    DOB: 07-30-1970, 43 y.o.   MRN: [id number]  HPI   HTN-  Currently maintained on lisinopril and amlodipine.  Denies CP, SOB or swelling. BP Readings from Last 3 Encounters:  04/30/14 120/82  04/23/14 146/72  01/01/14 112/78   Hyperlipidemia-   Lab Results  Component Value Date   CHOL 201* 06/06/2013   HDL 57 06/06/2013   LDLCALC 125* 06/06/2013   TRIG 93 06/06/2013   CHOLHDL 3.5 06/06/2013   Obesity- reports increased work stress.   Wt Readings from Last 3 Encounters:  04/30/14 282 lb 9.6 oz (128.187 kg)  04/23/14 275 lb (124.739 kg)  01/01/14 278 lb 1.9 oz (126.154 kg)     Body mass index is 39.43 kg/(m^2).  Review of Systems    see HPI  Past Medical History  Diagnosis Date  . Hyperlipidemia   . Hypertension   . History of migraine     History   Social History  . Marital Status: Single    Spouse Name: N/A    Number of Children: N/A  . Years of Education: N/A   Occupational History  . Not on file.   Social History Main Topics  . Smoking status: Passive Smoke Exposure - Never Smoker  . Smokeless tobacco: Never Used  . Alcohol Use: Yes     Comment: 1 beer a month  . Drug Use: No  . Sexual Activity: Not on file   Other Topics Concern  . 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     Single, no children   Lives with friend   Has a cat   Enjoys painting,writing, reading   Bachelors degree    Past Surgical History  Procedure Laterality Date  . Elbow surgery Left     "shattered elbow as a child"    Family History  Problem Relation Age of Onset  . Hyperlipidemia Mother   . Hypertension Mother   . Schizophrenia Maternal Aunt   . Arthritis Maternal Grandmother     No Known Allergies  Current Outpatient Prescriptions on File Prior to Visit  Medication Sig Dispense Refill  . amLODipine (NORVASC) 10 MG tablet Take 1 tablet (10 mg total) by  mouth daily. 90 tablet 0  . lisinopril (PRINIVIL,ZESTRIL) 10 MG tablet Take 10 mg by mouth daily.     No current facility-administered medications on file prior to visit.    BP 120/82 mmHg  Pulse 65  Temp(Src) 98.2 F (36.8 C) (Oral)  Resp 16  Ht 5\' 11"  (1.803 m)  Wt 282 lb 9.6 oz (128.187 kg)  BMI 39.43 kg/m2  SpO2 99%    Objective:   Physical Exam  Constitutional: He is oriented to person, place, and time. He appears well-developed and well-nourished. No distress.  HENT:  Head: Normocephalic and atraumatic.  Cardiovascular: Normal rate and regular rhythm.   No murmur heard. Pulmonary/Chest: Effort normal and breath sounds normal. No respiratory distress. He has no wheezes. He has no rales. He exhibits no tenderness.  Neurological: He is alert and oriented to person, place, and time.  Psychiatric: He has a normal mood and affect. His behavior is normal. Thought content normal.          Assessment & Plan:

## 2014-05-01 ENCOUNTER — Encounter: Payer: Self-pay | Admitting: Family

## 2014-06-18 ENCOUNTER — Encounter: Payer: Self-pay | Admitting: Family

## 2014-06-18 NOTE — Telephone Encounter (Signed)
Pt needs OV please 

## 2014-07-08 ENCOUNTER — Ambulatory Visit (HOSPITAL_BASED_OUTPATIENT_CLINIC_OR_DEPARTMENT_OTHER): Payer: BC Managed Care – PPO | Attending: Family

## 2014-08-31 ENCOUNTER — Other Ambulatory Visit: Payer: Self-pay | Admitting: Family

## 2014-10-13 ENCOUNTER — Other Ambulatory Visit: Payer: Self-pay | Admitting: Family

## 2014-10-29 ENCOUNTER — Encounter: Payer: Self-pay | Admitting: Physician Assistant

## 2014-10-29 ENCOUNTER — Ambulatory Visit (INDEPENDENT_AMBULATORY_CARE_PROVIDER_SITE_OTHER): Payer: BLUE CROSS/BLUE SHIELD | Admitting: Physician Assistant

## 2014-10-29 VITALS — BP 117/77 | HR 66 | Temp 98.2°F | Ht 71.0 in | Wt 282.6 lb

## 2014-10-29 DIAGNOSIS — I10 Essential (primary) hypertension: Secondary | ICD-10-CM

## 2014-10-29 LAB — BASIC METABOLIC PANEL
BUN: 8 mg/dL (ref 6–23)
CHLORIDE: 105 meq/L (ref 96–112)
CO2: 27 mEq/L (ref 19–32)
Calcium: 9.7 mg/dL (ref 8.4–10.5)
Creatinine, Ser: 0.98 mg/dL (ref 0.40–1.50)
GFR: 88.77 mL/min (ref >60.00)
Glucose, Bld: 100 mg/dL — ABNORMAL HIGH (ref 70–99)
POTASSIUM: 3.7 meq/L (ref 3.5–5.1)
Sodium: 138 mEq/L (ref 135–145)

## 2014-10-29 MED ORDER — AMLODIPINE BESYLATE 10 MG PO TABS: 10.0000 mg | ORAL_TABLET | Freq: Every day | ORAL | Status: DC

## 2014-10-29 MED ORDER — LISINOPRIL 10 MG PO TABS: 10.0000 mg | ORAL_TABLET | Freq: Every day | ORAL | Status: DC

## 2014-10-29 NOTE — Patient Instructions (Signed)
Please go to the lab for blood work. I will call you with your results. Continue medications as directed. Try to increase your daily exercise -- 15 minutes a few times a week is a good place to start, with our final goal being 150 minutes per week.  DASH Eating Plan DASH stands for "Dietary Approaches to Stop Hypertension." The DASH eating plan is a healthy eating plan that has been shown to reduce high blood pressure (hypertension). Additional health benefits may include reducing the risk of type 2 diabetes mellitus, heart disease, and stroke. The DASH eating plan may also help with weight loss. WHAT DO I NEED TO KNOW ABOUT THE DASH EATING PLAN? For the DASH eating plan, you will follow these general guidelines:  Choose foods with a percent daily value for sodium of less than 5% (as listed on the food label).  Use salt-free seasonings or herbs instead of table salt or sea salt.  Check with your health care provider or pharmacist before using salt substitutes.  Eat lower-sodium products, often labeled as "lower sodium" or "no salt added."  Eat fresh foods.  Eat more vegetables, fruits, and low-fat dairy products.  Choose whole grains. Look for the word "whole" as the first word in the ingredient list.  Choose fish and skinless chicken or [place] more often than red meat. Limit fish, poultry, and meat to 6 oz (170 g) each day.  Limit sweets, desserts, sugars, and sugary drinks.  Choose heart-healthy fats.  Limit cheese to 1 oz (28 g) per day.  Eat more home-cooked food and less restaurant, buffet, and fast food.  Limit fried foods.  Cook foods using methods other than frying.  Limit canned vegetables. If you do use them, rinse them well to decrease the sodium.  When eating at a restaurant, ask that your food be prepared with less salt, or no salt if possible. WHAT FOODS CAN I EAT? Seek help from a dietitian for individual calorie needs. Grains Whole grain or whole wheat  bread. Brown rice. Whole grain or whole wheat pasta. Quinoa, bulgur, and whole grain cereals. Low-sodium cereals. Corn or whole wheat flour tortillas. Whole grain cornbread. Whole grain crackers. Low-sodium crackers. Vegetables Fresh or frozen vegetables (raw, steamed, roasted, or grilled). Low-sodium or reduced-sodium tomato and vegetable juices. Low-sodium or reduced-sodium tomato sauce and paste. Low-sodium or reduced-sodium canned vegetables.  Fruits All fresh, canned (in natural juice), or frozen fruits. Meat and Other Protein Products Ground beef (85% or leaner), grass-fed beef, or beef trimmed of fat. Skinless chicken or [place]. Ground chicken or [place]. Pork trimmed of fat. All fish and seafood. Eggs. Dried beans, peas, or lentils. Unsalted nuts and seeds. Unsalted canned beans. Dairy Low-fat dairy products, such as skim or 1% milk, 2% or reduced-fat cheeses, low-fat ricotta or cottage cheese, or plain low-fat yogurt. Low-sodium or reduced-sodium cheeses. Fats and Oils Tub margarines without trans fats. Light or reduced-fat mayonnaise and salad dressings (reduced sodium). Avocado. Safflower, olive, or canola oils. Natural peanut or almond butter. Other Unsalted popcorn and pretzels. The items listed above may not be a complete list of recommended foods or beverages. Contact your dietitian for more options. WHAT FOODS ARE NOT RECOMMENDED? Grains White bread. White pasta. White rice. Refined cornbread. Bagels and croissants. Crackers that contain trans fat. Vegetables Creamed or fried vegetables. Vegetables in a cheese sauce. Regular canned vegetables. Regular canned tomato sauce and paste. Regular tomato and vegetable juices. Fruits Dried fruits. Canned fruit in light or heavy syrup. Fruit  juice. Meat and Other Protein Products Fatty cuts of meat. Ribs, chicken wings, bacon, sausage, bologna, salami, chitterlings, fatback, hot dogs, bratwurst, and packaged luncheon meats. Salted nuts and  seeds. Canned beans with salt. Dairy Whole or 2% milk, cream, half-and-half, and cream cheese. Whole-fat or sweetened yogurt. Full-fat cheeses or blue cheese. Nondairy creamers and whipped toppings. Processed cheese, cheese spreads, or cheese curds. Condiments Onion and garlic salt, seasoned salt, table salt, and sea salt. Canned and packaged gravies. Worcestershire sauce. Tartar sauce. Barbecue sauce. Teriyaki sauce. Soy sauce, including reduced sodium. Steak sauce. Fish sauce. Oyster sauce. Cocktail sauce. Horseradish. Ketchup and mustard. Meat flavorings and tenderizers. Bouillon cubes. Hot sauce. Tabasco sauce. Marinades. Taco seasonings. Relishes. Fats and Oils Butter, stick margarine, lard, shortening, ghee, and bacon fat. Coconut, palm kernel, or palm oils. Regular salad dressings. Other Pickles and olives. Salted popcorn and pretzels. The items listed above may not be a complete list of foods and beverages to avoid. Contact your dietitian for more information. WHERE CAN I FIND MORE INFORMATION? National Heart, Lung, and Blood Institute: travelstabloid.com Document Released: 05/21/2011 Document Revised: 10/16/2013 Document Reviewed: 04/05/2013 [facility] Patient Information 2015 [place], [place]. This information is not intended to replace advice given to you by your health care provider. Make sure you discuss any questions you have with your health care provider.

## 2014-10-29 NOTE — Progress Notes (Signed)
Pre visit review using our clinic review tool, if applicable. No additional management support is needed unless otherwise documented below in the visit note. 

## 2014-10-29 NOTE — Assessment & Plan Note (Signed)
Well-controlled.  Asymptomatic. Continue current regimen. DASH diet reiterated.  Exercise regimen discussed.  Medications refilled.  Follow-up in 6 months for CPE with PCP.

## 2014-10-29 NOTE — Progress Notes (Signed)
    Patient presents to clinic today for 74-month follow-up of hypertension.  Patient currently on amlodipine 10 mg daily and lisinopril 10 mg daily.  Endorses taking medications as directed. Patient denies chest pain, palpitations, lightheadedness, dizziness, vision changes or frequent headaches.  BP Readings from Last 3 Encounters:  10/29/14 117/77  04/30/14 120/82  04/23/14 146/72    Past Medical History  Diagnosis Date  . Hyperlipidemia   . Hypertension   . History of migraine     No current outpatient prescriptions on file prior to visit.   No current facility-administered medications on file prior to visit.    No Known Allergies  Family History  Problem Relation Age of Onset  . Hyperlipidemia Mother   . Hypertension Mother   . Schizophrenia Maternal Aunt   . Arthritis Maternal Grandmother     History   Social History  . Marital Status: Single    Spouse Name: N/A  . Number of Children: N/A  . Years of Education: N/A   Social History Main Topics  . Smoking status: Passive Smoke Exposure - Never Smoker  . Smokeless tobacco: Never Used  . Alcohol Use: Yes     Comment: 1 beer a month  . Drug Use: No  . Sexual Activity: Not on file   Other Topics Concern  . 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     Single, no children   Lives with friend   Has a cat   Enjoys painting,writing, reading   Bachelors degree   Review of Systems - See HPI.  All other ROS are negative.  BP 117/77 mmHg  Pulse 66  Temp(Src) 98.2 F (36.8 C) (Oral)  Ht 5\' 11"  (1.803 m)  Wt 282 lb 9.6 oz (128.187 kg)  BMI 39.43 kg/m2  SpO2 99%  Physical Exam  Constitutional: He is oriented to person, place, and time and well-developed, well-nourished, and in no distress.  HENT:  Head: Normocephalic and atraumatic.  Cardiovascular: Normal rate, regular rhythm, normal heart sounds and intact distal pulses.   Pulmonary/Chest: Effort normal and  breath sounds normal. No respiratory distress. He has no wheezes. He has no rales. He exhibits no tenderness.  Neurological: He is alert and oriented to person, place, and time.  Skin: Skin is warm and dry. No rash noted.  Psychiatric: Affect normal.  Vitals reviewed.    Assessment/Plan: HTN (hypertension) Well-controlled.  Asymptomatic. Continue current regimen. DASH diet reiterated.  Exercise regimen discussed.  Medications refilled.  Follow-up in 6 months for CPE with PCP.

## 2015-04-26 ENCOUNTER — Telehealth: Payer: Self-pay | Admitting: Behavioral Health

## 2015-04-26 NOTE — Telephone Encounter (Signed)
Unable to reach patient at time of Pre-Visit Call.  Left message for patient to return call when available.    

## 2015-04-29 ENCOUNTER — Encounter: Payer: Self-pay | Admitting: Family

## 2015-04-29 ENCOUNTER — Ambulatory Visit (INDEPENDENT_AMBULATORY_CARE_PROVIDER_SITE_OTHER): Payer: BLUE CROSS/BLUE SHIELD | Admitting: Family

## 2015-04-29 VITALS — BP 133/76 | HR 72 | Temp 98.3°F | Resp 18 | Ht 71.0 in | Wt 270.6 lb

## 2015-04-29 DIAGNOSIS — I10 Essential (primary) hypertension: Secondary | ICD-10-CM

## 2015-04-29 DIAGNOSIS — Z114 Encounter for screening for human immunodeficiency virus [HIV]: Secondary | ICD-10-CM

## 2015-04-29 DIAGNOSIS — Z23 Encounter for immunization: Secondary | ICD-10-CM | POA: Diagnosis not present

## 2015-04-29 DIAGNOSIS — Z0000 Encounter for general adult medical examination without abnormal findings: Secondary | ICD-10-CM | POA: Diagnosis not present

## 2015-04-29 DIAGNOSIS — L709 Acne, unspecified: Secondary | ICD-10-CM | POA: Diagnosis not present

## 2015-04-29 LAB — CBC WITH DIFFERENTIAL/PLATELET
BASOS PCT: 0.7 % (ref 0.0–3.0)
Basophils Absolute: 0.1 10*3/uL (ref 0.0–0.1)
EOS PCT: 1.9 % (ref 0.0–5.0)
Eosinophils Absolute: 0.1 10*3/uL (ref 0.0–0.7)
HCT: 44.4 % (ref 39.0–52.0)
Hemoglobin: 14.8 g/dL (ref 13.0–17.0)
LYMPHS ABS: 2.3 10*3/uL (ref 0.7–4.0)
Lymphocytes Relative: 30 % (ref 12.0–46.0)
MCHC: 33.4 g/dL (ref 30.0–36.0)
MCV: 86.8 fl (ref 78.0–100.0)
MONOS PCT: 8.4 % (ref 3.0–12.0)
Monocytes Absolute: 0.6 10*3/uL (ref 0.1–1.0)
NEUTROS ABS: 4.4 10*3/uL (ref 1.4–7.7)
NEUTROS PCT: 59 % (ref 43.0–77.0)
Platelets: 323 10*3/uL (ref 150.0–400.0)
RBC: 5.12 Mil/uL (ref 4.22–5.81)
RDW: 13.1 % (ref 11.5–15.5)
WBC: 7.5 10*3/uL (ref 4.0–10.5)

## 2015-04-29 LAB — URINALYSIS, ROUTINE W REFLEX MICROSCOPIC
Bilirubin Urine: NEGATIVE
Ketones, ur: NEGATIVE
Leukocytes, UA: NEGATIVE
NITRITE: NEGATIVE
SPECIFIC GRAVITY, URINE: 1.025 (ref 1.000–1.030)
TOTAL PROTEIN, URINE-UPE24: NEGATIVE
URINE GLUCOSE: NEGATIVE
Urobilinogen, UA: 0.2 (ref 0.0–1.0)
pH: 6 (ref 5.0–8.0)

## 2015-04-29 LAB — HEPATIC FUNCTION PANEL
ALBUMIN: 4.6 g/dL (ref 3.5–5.2)
ALT: 35 U/L (ref 0–53)
AST: 23 U/L (ref 0–37)
Alkaline Phosphatase: 54 U/L (ref 39–117)
Bilirubin, Direct: 0.1 mg/dL (ref 0.0–0.3)
Total Bilirubin: 0.9 mg/dL (ref 0.2–1.2)
Total Protein: 7.3 g/dL (ref 6.0–8.3)

## 2015-04-29 LAB — HIV ANTIBODY (ROUTINE TESTING W REFLEX): HIV 1&2 Ab, 4th Generation: NONREACTIVE

## 2015-04-29 LAB — LIPID PANEL
Cholesterol: 189 mg/dL (ref 0–200)
HDL: 52.3 mg/dL (ref >39.00)
LDL CALC: 121 mg/dL — AB (ref 0–99)
NonHDL: 137.14
TRIGLYCERIDES: 82 mg/dL (ref 0.0–149.0)
Total CHOL/HDL Ratio: 4
VLDL: 16.4 mg/dL (ref 0.0–40.0)

## 2015-04-29 LAB — BASIC METABOLIC PANEL
BUN: 9 mg/dL (ref 6–23)
CO2: 29 mEq/L (ref 19–32)
Calcium: 10.2 mg/dL (ref 8.4–10.5)
Chloride: 104 mEq/L (ref 96–112)
Creatinine, Ser: 1.04 mg/dL (ref 0.40–1.50)
GFR: 82.69 mL/min (ref >60.00)
GLUCOSE: 104 mg/dL — AB (ref 70–99)
POTASSIUM: 4.1 meq/L (ref 3.5–5.1)
Sodium: 140 mEq/L (ref 135–145)

## 2015-04-29 LAB — TSH: TSH: 1.11 u[IU]/mL (ref 0.35–4.50)

## 2015-04-29 MED ORDER — AMLODIPINE BESYLATE 10 MG PO TABS: 10.0000 mg | ORAL_TABLET | Freq: Every day | ORAL | Status: DC

## 2015-04-29 MED ORDER — BENZOYL PEROXIDE-ERYTHROMYCIN 5-3 % EX GEL: Freq: Two times a day (BID) | CUTANEOUS | Status: DC

## 2015-04-29 MED ORDER — LISINOPRIL 10 MG PO TABS: 10.0000 mg | ORAL_TABLET | Freq: Every day | ORAL | Status: DC

## 2015-04-29 NOTE — Progress Notes (Signed)
Pre visit review using our clinic review tool, if applicable. No additional management support is needed unless otherwise documented below in the visit note. 

## 2015-04-29 NOTE — Assessment & Plan Note (Signed)
Trial of benzamycin gel.  If no improvement plan referral to derm.

## 2015-04-29 NOTE — Assessment & Plan Note (Signed)
Encouraged pt to continue healthy diet, exercise and weight loss. Tdap and flu shot today.  Obtain routine lab work.

## 2015-04-29 NOTE — Assessment & Plan Note (Signed)
BP stable, continue current meds. 

## 2015-04-29 NOTE — Patient Instructions (Signed)
Please complete lab work prior to leaving. Schedule an eye exam at your convenience. Keep up the good work with diet/exercise/weight loss.

## 2015-04-29 NOTE — Progress Notes (Signed)
Subjective:    Patient ID: Chad Mejia, male    DOB: 18-Mar-1971, 44 y.o.   MRN: [id number]  HPI  Chad Mejia is a 44 yr old male who presents today for cpx.  Patient presents today for complete physical.  Immunizations: Due for Tetanus Diet:trying to lose weight.   Wt Readings from Last 3 Encounters:  04/29/15 270 lb 9.6 oz (122.743 kg)  10/29/14 282 lb 9.6 oz (128.187 kg)  04/30/14 282 lb 9.6 oz (128.187 kg)  Exercise: trying to walk on his breaks at work.  Dental:  Up to date Eye exam:  Due  Review of Systems  Constitutional: Negative for unexpected weight change.  HENT: Negative for hearing loss and rhinorrhea.   Eyes: Negative for visual disturbance.  Respiratory: Negative for cough and shortness of breath.   Cardiovascular: Negative for chest pain.  Gastrointestinal: Negative for diarrhea and constipation.  Genitourinary: Negative for dysuria and frequency.  Musculoskeletal: Negative for myalgias.       Some arthritis pain- unchanged  Skin:       + facial acne  Neurological: Negative for headaches.  Hematological: Negative for adenopathy.  Psychiatric/Behavioral:       Denies depression/anxiety       Past Medical History  Diagnosis Date  . Hyperlipidemia   . Hypertension   . History of migraine     Social History   Social History  . Marital Status: Single    Spouse Name: N/A  . Number of Children: N/A  . Years of Education: N/A   Occupational History  . Not on file.   Social History Main Topics  . Smoking status: Passive Smoke Exposure - Never Smoker  . Smokeless tobacco: Never Used  . Alcohol Use: Yes     Comment: 1 beer a month  . Drug Use: No  . Sexual Activity: Not on file   Other Topics Concern  . 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     Single, no children   Lives with friend   Has a cat   Enjoys painting,writing, reading   Bachelors degree    Past Surgical History    Procedure Laterality Date  . Elbow surgery Left     "shattered elbow as a child"    Family History  Problem Relation Age of Onset  . Hyperlipidemia Mother   . Hypertension Mother   . Schizophrenia Maternal Aunt   . Arthritis Maternal Grandmother     No Known Allergies  Current Outpatient Prescriptions on File Prior to Visit  Medication Sig Dispense Refill  . amLODipine (NORVASC) 10 MG tablet Take 1 tablet (10 mg total) by mouth daily. 90 tablet 1  . lisinopril (PRINIVIL,ZESTRIL) 10 MG tablet Take 1 tablet (10 mg total) by mouth daily. 90 tablet 1   No current facility-administered medications on file prior to visit.    BP 133/76 mmHg  Pulse 72  Temp(Src) 98.3 F (36.8 C) (Oral)  Resp 18  Ht 5\' 11"  (1.803 m)  Wt 270 lb 9.6 oz (122.743 kg)  BMI 37.76 kg/m2  SpO2 100%    Objective:   Physical Exam  Physical Exam  Constitutional: Overweight white male. He is oriented to person, place, and time. He appears well-developed and well-nourished. No distress.  HENT:  Head: Normocephalic and atraumatic.  Right Ear: Tympanic membrane and ear canal normal.  Left Ear: Tympanic membrane and ear canal normal.  Mouth/Throat: Oropharynx is clear  and moist.  Eyes: Pupils are equal, round, and reactive to light. No scleral icterus.  Neck: Normal range of motion. No thyromegaly present.  Cardiovascular: Normal rate and regular rhythm.   No murmur heard. Pulmonary/Chest: Effort normal and breath sounds normal. No respiratory distress. He has no wheezes. He has no rales. He exhibits no tenderness.  Abdominal: Soft. Bowel sounds are normal. He exhibits no distension and no mass. There is no tenderness. There is no rebound and no guarding.  Musculoskeletal: He exhibits no edema.  Lymphadenopathy:    He has no cervical adenopathy.  Neurological: He is alert and oriented to person, place, and time. He has normal patellar reflexes. He exhibits normal muscle tone. Coordination normal.   Skin: Skin is warm and dry. + acne noted on face Psychiatric: He has a normal mood and affect. His behavior is normal. Judgment and thought content normal.          Assessment & Plan:        Assessment & Plan:  EKG tracing is personally reviewed.  EKG notes NSR.  No acute changes.

## 2015-04-30 ENCOUNTER — Encounter: Payer: Self-pay | Admitting: Family

## 2015-07-26 ENCOUNTER — Encounter: Payer: Self-pay | Admitting: Family

## 2015-07-29 ENCOUNTER — Ambulatory Visit (INDEPENDENT_AMBULATORY_CARE_PROVIDER_SITE_OTHER): Payer: BLUE CROSS/BLUE SHIELD | Admitting: Family

## 2015-07-29 ENCOUNTER — Encounter: Payer: Self-pay | Admitting: Family

## 2015-07-29 ENCOUNTER — Telehealth: Payer: Self-pay | Admitting: *Deleted

## 2015-07-29 VITALS — BP 124/86 | HR 79 | Temp 98.0°F | Resp 16 | Ht 71.0 in | Wt 267.0 lb

## 2015-07-29 DIAGNOSIS — F329 Major depressive disorder, single episode, unspecified: Secondary | ICD-10-CM | POA: Insufficient documentation

## 2015-07-29 DIAGNOSIS — F321 Major depressive disorder, single episode, moderate: Secondary | ICD-10-CM

## 2015-07-29 DIAGNOSIS — R0609 Other forms of dyspnea: Secondary | ICD-10-CM | POA: Diagnosis not present

## 2015-07-29 DIAGNOSIS — R0789 Other chest pain: Secondary | ICD-10-CM

## 2015-07-29 LAB — D-DIMER, QUANTITATIVE: D-Dimer, Quant: 0.27 ug/mL-FEU (ref 0.00–0.48)

## 2015-07-29 MED ORDER — SERTRALINE HCL 50 MG PO TABS: ORAL_TABLET | ORAL | Status: DC

## 2015-07-29 NOTE — Assessment & Plan Note (Signed)
Patient scored 15 on PHQ-9. Also had patient complete mood disorder questionnaire which was negative. (Negative screen for mood disorder).  Will initiate Zoloft 50mg . I instructed pt to start 1/2 tablet once daily for 1 week and then increase to a full tablet once daily on week two as tolerated.  We discussed common side effects such as nausea, drowsiness, sexual side effects and weight gain.  Also discussed rare but serious side effect of suicide ideation.  He is instructed to discontinue medication go directly to ED if this occurs.  Pt verbalizes understanding. Also advised pt to establish with therapist for counseling.  Plan follow up in 1 month to evaluate progress.   30 minutes spent with pt.  >50% of this time was spent on counseling patient on depression.

## 2015-07-29 NOTE — Patient Instructions (Addendum)
Please complete lab work prior to leaving. Start zoloft 50mg  1/2 tab once daily for 1 week, then increase to a full tab once daily on week two. [place] to schedule an appointment with one of our therapists.  [phone] You will be contacted about your referral for echo and exercise stress test. Go to the ER if you develop severe chest pain or shortness of breath.  Follow up in 1 month.

## 2015-07-29 NOTE — Progress Notes (Signed)
Pre visit review using our clinic review tool, if applicable. No additional management support is needed unless otherwise documented below in the visit note. 

## 2015-07-29 NOTE — Progress Notes (Signed)
Subjective:    Patient ID: Chad Mejia, male    DOB: 1971-06-03, 45 y.o.   MRN: [id number]  HPI  Chad Mejia is a 45 yr old male who presents today to discuss anxiety/anger. He is accompanied by a male companion today.  Pt reports that he had an episode of chest feeling tight with associated SOB on Wednesday night.  Notes that he becomes easily winded with exertion.  Work stress has worsened.  Feels overwhelmed.  Easily becomes angry.  Reports that he can become explosive.  Notes some aches in his left arm, but has hx of elbow and wrist fracture on the left.  Denies SI/HI.  Denies calf pain/swelling, denies recent long travel.   Review of Systems See HPI  Past Medical History  Diagnosis Date  . Hyperlipidemia   . Hypertension   . History of migraine     Social History   Social History  . Marital Status: Single    Spouse Name: N/A  . Number of Children: N/A  . Years of Education: N/A   Occupational History  . Not on file.   Social History Main Topics  . Smoking status: Passive Smoke Exposure - Never Smoker  . Smokeless tobacco: Never Used  . Alcohol Use: Yes     Comment: 1 beer a month  . Drug Use: No  . Sexual Activity: Not on file   Other Topics Concern  . 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     Single, no children   Lives with friend   Has a cat   Enjoys painting,writing, reading   Bachelors degree    Past Surgical History  Procedure Laterality Date  . Elbow surgery Left     "shattered elbow as a child"    Family History  Problem Relation Age of Onset  . Hyperlipidemia Mother   . Hypertension Mother   . Schizophrenia Maternal Aunt   . Arthritis Maternal Grandmother     No Known Allergies  Current Outpatient Prescriptions on File Prior to Visit  Medication Sig Dispense Refill  . amLODipine (NORVASC) 10 MG tablet Take 1 tablet (10 mg total) by mouth daily. 90 tablet 1  . lisinopril  (PRINIVIL,ZESTRIL) 10 MG tablet Take 1 tablet (10 mg total) by mouth daily. 90 tablet 1  . benzoyl peroxide-erythromycin (BENZAMYCIN) gel Apply topically 2 (two) times daily. (Patient not taking: Reported on 07/29/2015) 23.3 g 1   No current facility-administered medications on file prior to visit.    BP 124/86 mmHg  Pulse 79  Temp(Src) 98 F (36.7 C) (Oral)  Resp 16  Ht 5\' 11"  (1.803 m)  Wt 267 lb (121.11 kg)  BMI 37.26 kg/m2  SpO2 96%       Objective:   Physical Exam  Constitutional: He is oriented to person, place, and time. He appears well-developed and well-nourished. No distress.  Unkempt, + body odor noted  HENT:  Head: Normocephalic and atraumatic.  Cardiovascular: Normal rate and regular rhythm.   No murmur heard. Pulmonary/Chest: Effort normal and breath sounds normal. No respiratory distress. He has no wheezes. He has no rales.  Musculoskeletal: He exhibits no edema.  Neurological: He is alert and oriented to person, place, and time.  Skin: Skin is warm and dry.  Psychiatric: His behavior is normal. Thought content normal.  Flat affect          Assessment & Plan:  Atypical CP- EKG tracing is  personally reviewed.  EKG notes NSR.  No acute changes. Doubt cardiac etiology. Will refer for 2D echo and an exercise stress test. Due to DOE/SOB, I would also like to exclude PE. Will obtain stat D dimer.

## 2015-07-29 NOTE — Telephone Encounter (Signed)
Notified pt. 

## 2015-07-29 NOTE — Telephone Encounter (Signed)
D dimer normal.  Please let pt know that pulmonary embolus screening is negative.

## 2015-07-29 NOTE — Telephone Encounter (Signed)
Received call from Maudie Mercury with [facility] for stat D-dimer result from today. Result is in EPIC.  Please advise.

## 2015-08-13 ENCOUNTER — Ambulatory Visit (INDEPENDENT_AMBULATORY_CARE_PROVIDER_SITE_OTHER): Payer: BLUE CROSS/BLUE SHIELD

## 2015-08-13 ENCOUNTER — Other Ambulatory Visit: Payer: Self-pay

## 2015-08-13 ENCOUNTER — Ambulatory Visit (HOSPITAL_COMMUNITY): Payer: BLUE CROSS/BLUE SHIELD | Attending: Cardiovascular Disease

## 2015-08-13 DIAGNOSIS — I119 Hypertensive heart disease without heart failure: Secondary | ICD-10-CM | POA: Insufficient documentation

## 2015-08-13 DIAGNOSIS — E785 Hyperlipidemia, unspecified: Secondary | ICD-10-CM | POA: Insufficient documentation

## 2015-08-13 DIAGNOSIS — R0600 Dyspnea, unspecified: Secondary | ICD-10-CM | POA: Diagnosis present

## 2015-08-13 DIAGNOSIS — R0609 Other forms of dyspnea: Secondary | ICD-10-CM | POA: Diagnosis not present

## 2015-08-13 DIAGNOSIS — R0789 Other chest pain: Secondary | ICD-10-CM | POA: Diagnosis not present

## 2015-08-13 LAB — EXERCISE TOLERANCE TEST
CHL CUP MPHR: 177 {beats}/min
CHL CUP RESTING HR STRESS: 68 {beats}/min
CHL CUP STRESS STAGE 1 HR: 86 {beats}/min
CHL CUP STRESS STAGE 2 GRADE: 0 %
CHL CUP STRESS STAGE 2 SPEED: 1 mph
CHL CUP STRESS STAGE 3 GRADE: 0.1 %
CHL CUP STRESS STAGE 3 SPEED: 1 mph
CHL CUP STRESS STAGE 4 DBP: 60 mmHg
CHL CUP STRESS STAGE 4 GRADE: 10 %
CHL CUP STRESS STAGE 5 DBP: 65 mmHg
CHL CUP STRESS STAGE 5 SBP: 188 mmHg
CHL CUP STRESS STAGE 6 SPEED: 3.4 mph
CHL CUP STRESS STAGE 7 HR: 150 {beats}/min
CHL CUP STRESS STAGE 7 SBP: 135 mmHg
CHL CUP STRESS STAGE 7 SPEED: 0 mph
CHL CUP STRESS STAGE 8 HR: 98 {beats}/min
CHL CUP STRESS STAGE 8 SBP: 128 mmHg
CHL RATE OF PERCEIVED EXERTION: 18
CSEPED: 8 min
CSEPEDS: 0 s
CSEPEW: 10.1 METS
CSEPPHR: 176 {beats}/min
CSEPPMHR: 99 %
Percent HR: 99 %
Stage 1 DBP: 66 mmHg
Stage 1 Grade: 0 %
Stage 1 SBP: 120 mmHg
Stage 1 Speed: 0 mph
Stage 2 HR: 86 {beats}/min
Stage 3 HR: 87 {beats}/min
Stage 4 HR: 125 {beats}/min
Stage 4 SBP: 192 mmHg
Stage 4 Speed: 1.7 mph
Stage 5 Grade: 12 %
Stage 5 HR: 160 {beats}/min
Stage 5 Speed: 2.5 mph
Stage 6 Grade: 14 %
Stage 6 HR: 176 {beats}/min
Stage 7 DBP: 59 mmHg
Stage 7 Grade: 0 %
Stage 8 DBP: 58 mmHg
Stage 8 Grade: 0 %
Stage 8 Speed: 0 mph

## 2015-08-14 ENCOUNTER — Encounter: Payer: Self-pay | Admitting: Family

## 2015-08-26 ENCOUNTER — Ambulatory Visit (INDEPENDENT_AMBULATORY_CARE_PROVIDER_SITE_OTHER): Payer: BLUE CROSS/BLUE SHIELD | Admitting: Family

## 2015-08-26 ENCOUNTER — Encounter: Payer: Self-pay | Admitting: Family

## 2015-08-26 VITALS — BP 119/69 | HR 60 | Temp 97.6°F | Resp 16 | Ht 71.0 in | Wt 267.0 lb

## 2015-08-26 DIAGNOSIS — R0789 Other chest pain: Secondary | ICD-10-CM

## 2015-08-26 DIAGNOSIS — F321 Major depressive disorder, single episode, moderate: Secondary | ICD-10-CM | POA: Diagnosis not present

## 2015-08-26 MED ORDER — SERTRALINE HCL 50 MG PO TABS: ORAL_TABLET | ORAL | Status: DC

## 2015-08-26 NOTE — Assessment & Plan Note (Signed)
Improved. Continue zoloft. I have advised pt to monitor his weight while on zoloft.  He reports that he plans to begin walking and hopes to lose some weight.

## 2015-08-26 NOTE — Progress Notes (Signed)
Pre visit review using our clinic review tool, if applicable. No additional management support is needed unless otherwise documented below in the visit note. 

## 2015-08-26 NOTE — Patient Instructions (Signed)
-

## 2015-08-26 NOTE — Progress Notes (Signed)
Subjective:     Patient ID: Chad Mejia, male   DOB: [birth year], 45 y.o.   MRN: [id number]  HPI  Mr. Yamazaki is a 45 yr old male who presents today for follow up of depression.  Last visit we initiated zoloft 50 mg for his depression symptoms. Pt reports good compliance with zoloft. Denies side any noticeable effects.  He reports that his girlfriend has noted a marked improvement in his mood.  He reports overall feeling less stressed. Trying to leave work "on time" and take breaks which is helping him. Feels less angry.    He denies chest pain/pressure. Did have a neg stress test and normal echo since his last visit.    Review of Systems See HPI  Past Medical History  Diagnosis Date  . Hyperlipidemia   . Hypertension   . History of migraine     Social History   Social History  . Marital Status: Single    Spouse Name: N/A  . Number of Children: N/A  . Years of Education: N/A   Occupational History  . Not on file.   Social History Main Topics  . Smoking status: Passive Smoke Exposure - Never Smoker  . Smokeless tobacco: Never Used  . Alcohol Use: Yes     Comment: 1 beer a month  . Drug Use: No  . Sexual Activity: Not on file   Other Topics Concern  . 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     Single, no children   Lives with friend   Has a cat   Enjoys painting,writing, reading   Bachelors degree    Past Surgical History  Procedure Laterality Date  . Elbow surgery Left     "shattered elbow as a child"    Family History  Problem Relation Age of Onset  . Hyperlipidemia Mother   . Hypertension Mother   . Schizophrenia Maternal Aunt   . Arthritis Maternal Grandmother   . Drug abuse Sister     died from drug overdose, 1/2 sister    No Known Allergies  Current Outpatient Prescriptions on File Prior to Visit  Medication Sig Dispense Refill  . amLODipine (NORVASC) 10 MG tablet Take 1 tablet (10 mg total)  by mouth daily. 90 tablet 1  . benzoyl peroxide-erythromycin (BENZAMYCIN) gel Apply topically 2 (two) times daily. 23.3 g 1  . lisinopril (PRINIVIL,ZESTRIL) 10 MG tablet Take 1 tablet (10 mg total) by mouth daily. 90 tablet 1  . sertraline (ZOLOFT) 50 MG tablet 1/2 tab by mouth once daily for 1 week,then increase to a full tab once daily on week two 30 tablet 0   No current facility-administered medications on file prior to visit.    BP 119/69 mmHg  Pulse 60  Temp(Src) 97.6 F (36.4 C) (Oral)  Resp 16  Ht 5\' 11"  (1.803 m)  Wt 267 lb (121.11 kg)  BMI 37.26 kg/m2  SpO2 99%       Objective:   Physical Exam  Constitutional: He is oriented to person, place, and time. He appears well-developed and well-nourished. No distress.  HENT:  Head: Normocephalic and atraumatic.  Musculoskeletal: He exhibits no edema.  Neurological: He is alert and oriented to person, place, and time.  Skin: Skin is warm and dry.  + facial acne  Psychiatric: He has a normal mood and affect. His behavior is normal. Thought content normal.       Assessment:  Plan:        Atypical CP- resolved.  ETT and echo were normal.

## 2015-08-27 ENCOUNTER — Other Ambulatory Visit: Payer: Self-pay | Admitting: Family

## 2015-10-28 ENCOUNTER — Ambulatory Visit (INDEPENDENT_AMBULATORY_CARE_PROVIDER_SITE_OTHER): Payer: BLUE CROSS/BLUE SHIELD | Admitting: Family

## 2015-10-28 ENCOUNTER — Encounter: Payer: Self-pay | Admitting: Family

## 2015-10-28 VITALS — BP 120/80 | HR 63 | Temp 98.3°F | Resp 16 | Ht 71.0 in | Wt 274.2 lb

## 2015-10-28 DIAGNOSIS — L709 Acne, unspecified: Secondary | ICD-10-CM

## 2015-10-28 DIAGNOSIS — F321 Major depressive disorder, single episode, moderate: Secondary | ICD-10-CM

## 2015-10-28 DIAGNOSIS — I10 Essential (primary) hypertension: Secondary | ICD-10-CM | POA: Diagnosis not present

## 2015-10-28 LAB — BASIC METABOLIC PANEL
BUN: 10 mg/dL (ref 6–23)
CALCIUM: 9.4 mg/dL (ref 8.4–10.5)
CO2: 25 mEq/L (ref 19–32)
CREATININE: 0.95 mg/dL (ref 0.40–1.50)
Chloride: 105 mEq/L (ref 96–112)
GFR: 91.58 mL/min (ref >60.00)
Glucose, Bld: 103 mg/dL — ABNORMAL HIGH (ref 70–99)
Potassium: 3.9 mEq/L (ref 3.5–5.1)
Sodium: 138 mEq/L (ref 135–145)

## 2015-10-28 MED ORDER — BENZOYL PEROXIDE-ERYTHROMYCIN 5-3 % EX GEL: Freq: Two times a day (BID) | CUTANEOUS | Status: DC

## 2015-10-28 MED ORDER — AMLODIPINE BESYLATE 10 MG PO TABS: 10.0000 mg | ORAL_TABLET | Freq: Every day | ORAL | Status: DC

## 2015-10-28 MED ORDER — LISINOPRIL 10 MG PO TABS: 10.0000 mg | ORAL_TABLET | Freq: Every day | ORAL | Status: DC

## 2015-10-28 NOTE — Assessment & Plan Note (Signed)
BP stable, continue current meds. Obtain bmet.

## 2015-10-28 NOTE — Assessment & Plan Note (Signed)
Deteriorated, resume benzamycin gel.

## 2015-10-28 NOTE — Progress Notes (Signed)
   Subjective:    Patient ID: Chad Mejia, male    DOB: 12-Aug-1970, 45 y.o.   MRN: [id number]  HPI  HTN-   BP Readings from Last 3 Encounters:  10/28/15 120/80  08/26/15 119/69  07/29/15 124/86   Depression-  On zoloft 50mg . Reports that zoloft is helping.  Mood is good.  Work has been more stressful and he reports that he is a stress eater.  Wt Readings from Last 3 Encounters:  10/28/15 274 lb 3.2 oz (124.376 kg)  08/26/15 267 lb (121.11 kg)  07/29/15 267 lb (121.11 kg)   Acne- out of benzamycin Gel.   Review of Systems See HPI  Past Medical History  Diagnosis Date  . Hyperlipidemia   . Hypertension   . History of migraine      Social History   Social History  . Marital Status: Single    Spouse Name: N/A  . Number of Children: N/A  . Years of Education: N/A   Occupational History  . Not on file.   Social History Main Topics  . Smoking status: Passive Smoke Exposure - Never Smoker  . Smokeless tobacco: Never Used  . Alcohol Use: Yes     Comment: 1 beer a month  . Drug Use: No  . Sexual Activity: Not on file   Other Topics Concern  . 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     Single, no children   Lives with friend   Has a cat   Enjoys painting,writing, reading   Bachelors degree    Past Surgical History  Procedure Laterality Date  . Elbow surgery Left     "shattered elbow as a child"    Family History  Problem Relation Age of Onset  . Hyperlipidemia Mother   . Hypertension Mother   . Schizophrenia Maternal Aunt   . Arthritis Maternal Grandmother   . Drug abuse Sister     died from drug overdose, 1/2 sister    No Known Allergies  Current Outpatient Prescriptions on File Prior to Visit  Medication Sig Dispense Refill  . sertraline (ZOLOFT) 50 MG tablet 1 tab by mouth once daily 30 tablet 3   No current facility-administered medications on file prior to visit.    BP 120/80 mmHg   Pulse 63  Temp(Src) 98.3 F (36.8 C) (Oral)  Resp 16  Ht 5\' 11"  (1.803 m)  Wt 274 lb 3.2 oz (124.376 kg)  BMI 38.26 kg/m2  SpO2 100%       Objective:   Physical Exam  Constitutional: He is oriented to person, place, and time. He appears well-developed and well-nourished. No distress.  HENT:  Head: Normocephalic and atraumatic.  Cardiovascular: Normal rate and regular rhythm.   No murmur heard. Pulmonary/Chest: Effort normal and breath sounds normal. No respiratory distress. He has no wheezes. He has no rales.  Musculoskeletal: He exhibits no edema.  Neurological: He is alert and oriented to person, place, and time.  Skin: Skin is warm and dry.  + facial acne noted  Psychiatric: He has a normal mood and affect. His behavior is normal. Thought content normal.          Assessment & Plan:

## 2015-10-28 NOTE — Patient Instructions (Addendum)
Try to work on healthy diet, exercise, weight loss. Bring fresh/healthy foods to work. Complete lab work prior to leaving.

## 2015-10-28 NOTE — Progress Notes (Signed)
Pre visit review using our clinic review tool, if applicable. No additional management support is needed unless otherwise documented below in the visit note. 

## 2015-10-28 NOTE — Assessment & Plan Note (Signed)
Stable/improved on zoloft. We did discuss his weight gain and need to work on healthy diet, exercise, weight loss.

## 2015-11-25 ENCOUNTER — Ambulatory Visit: Payer: BLUE CROSS/BLUE SHIELD | Admitting: Family

## 2015-12-28 ENCOUNTER — Other Ambulatory Visit: Payer: Self-pay | Admitting: Family

## 2015-12-30 NOTE — Telephone Encounter (Signed)
Rx sent to the pharmacy by e-script.//AB/CMA 

## 2016-01-28 ENCOUNTER — Ambulatory Visit: Payer: BLUE CROSS/BLUE SHIELD | Admitting: Family

## 2016-01-29 ENCOUNTER — Encounter: Payer: Self-pay | Admitting: Family

## 2016-01-29 ENCOUNTER — Ambulatory Visit (INDEPENDENT_AMBULATORY_CARE_PROVIDER_SITE_OTHER): Payer: BLUE CROSS/BLUE SHIELD | Admitting: Family

## 2016-01-29 DIAGNOSIS — L709 Acne, unspecified: Secondary | ICD-10-CM

## 2016-01-29 DIAGNOSIS — I10 Essential (primary) hypertension: Secondary | ICD-10-CM | POA: Diagnosis not present

## 2016-01-29 DIAGNOSIS — F321 Major depressive disorder, single episode, moderate: Secondary | ICD-10-CM | POA: Diagnosis not present

## 2016-01-29 MED ORDER — BENZOYL PEROXIDE-ERYTHROMYCIN 5-3 % EX GEL: Freq: Two times a day (BID) | CUTANEOUS | 5 refills | Status: DC

## 2016-01-29 MED ORDER — DULOXETINE HCL 30 MG PO CPEP: ORAL_CAPSULE | ORAL | 0 refills | Status: DC

## 2016-01-29 NOTE — Assessment & Plan Note (Signed)
Was improved on benzamycin gel. Resume- refill provided.

## 2016-01-29 NOTE — Assessment & Plan Note (Signed)
BP stable on current meds, continue same.  

## 2016-01-29 NOTE — Progress Notes (Signed)
Pre visit review using our clinic review tool, if applicable. No additional management support is needed unless otherwise documented below in the visit note. 

## 2016-01-29 NOTE — Assessment & Plan Note (Signed)
Stable but I am concerned about his ongoing weight gain.  Will transition him off of zoloft and onto cymbalta.  Pt advised as follows: Decrease zoloft to 1/2 tablet once daily for 1 week and begin cymbalta 30mg  once daily. On week two stop zoloft and increase cymbalta to 60mg  once daily.  Work on [employer] and trying to get regular exercise.

## 2016-01-29 NOTE — Progress Notes (Signed)
Subjective:    Patient ID: Chad Mejia, male    DOB: 07/15/1970, 45 y.o.   MRN: [id number]  HPI  Chad Mejia is a 45 yr old male who presents today for follow up.  1) HTN- on amlodipine 10 and lisinopril 10mg .  BP Readings from Last 3 Encounters:  01/29/16 120/80  10/28/15 120/80  08/26/15 119/69   2) Depression- maintained on zoloft 50mg . He has gained 13 pounds.  Reports that mood overall is good.  Thinks that he is eating more on the zoloft. Continues to have a a lot or stress  At work.  Wt Readings from Last 3 Encounters:  01/29/16 279 lb (126.6 kg)  10/28/15 274 lb 3.2 oz (124.4 kg)  08/26/15 267 lb (121.1 kg)   3) Acne- maintained on benzamycin gel. Seemed to be helping but he ran out.    Review of Systems  Respiratory: Negative for shortness of breath.   Cardiovascular: Negative for chest pain and leg swelling.   See HPI  Past Medical History:  Diagnosis Date  . History of migraine   . Hyperlipidemia   . Hypertension      Social History   Social History  . Marital status: Single    Spouse name: N/A  . Number of children: N/A  . Years of education: N/A   Occupational History  . Not on file.   Social History Main Topics  . Smoking status: Passive Smoke Exposure - Never Smoker  . Smokeless tobacco: Never Used  . Alcohol use Yes     Comment: 1 beer a month  . Drug use: No  . Sexual activity: Not on file   Other Topics Concern  . 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     Single, no children   Lives with friend   Has a cat   Enjoys painting,writing, reading   Bachelors degree    Past Surgical History:  Procedure Laterality Date  . ELBOW SURGERY Left    "shattered elbow as a child"    Family History  Problem Relation Age of Onset  . Hyperlipidemia Mother   . Hypertension Mother   . Schizophrenia Maternal Aunt   . Arthritis Maternal Grandmother   . Drug abuse Sister     died from  drug overdose, 1/2 sister    No Known Allergies  Current Outpatient Prescriptions on File Prior to Visit  Medication Sig Dispense Refill  . amLODipine (NORVASC) 10 MG tablet Take 1 tablet (10 mg total) by mouth daily. 90 tablet 1  . benzoyl peroxide-erythromycin (BENZAMYCIN) gel Apply topically 2 (two) times daily. 23.3 g 1  . lisinopril (PRINIVIL,ZESTRIL) 10 MG tablet Take 1 tablet (10 mg total) by mouth daily. 90 tablet 1  . sertraline (ZOLOFT) 50 MG tablet TAKE 1 TABLET BY MOUTH EVERY DAY 30 tablet 5   No current facility-administered medications on file prior to visit.     BP 120/80   Pulse 78   Temp 97.9 F (36.6 C) (Oral)   Resp 16   Ht 5\' 11"  (1.803 m)   Wt 279 lb (126.6 kg)   SpO2 98% Comment: room air  BMI 38.91 kg/m       Objective:   Physical Exam  Constitutional: He is oriented to person, place, and time. He appears well-developed and well-nourished. No distress.  HENT:  Head: Normocephalic and atraumatic.  Cardiovascular: Normal rate and regular rhythm.   No murmur  heard. Pulmonary/Chest: Effort normal and breath sounds normal. No respiratory distress. He has no wheezes. He has no rales.  Musculoskeletal: He exhibits no edema.  Lymphadenopathy:    He has no cervical adenopathy.  Neurological: He is alert and oriented to person, place, and time.  Skin: Skin is warm and dry.  + facial acne is noted.   Psychiatric: He has a normal mood and affect. His behavior is normal. Thought content normal.          Assessment & Plan:

## 2016-01-29 NOTE — Patient Instructions (Addendum)
Decrease zoloft to 1/2 tablet once daily for 1 week and begin cymbalta 30mg  once daily. On week two stop zoloft and increase cymbalta to 60mg  once daily.  Work on [employer] and trying to get regular exercise.

## 2016-02-28 ENCOUNTER — Ambulatory Visit (INDEPENDENT_AMBULATORY_CARE_PROVIDER_SITE_OTHER): Payer: BLUE CROSS/BLUE SHIELD | Admitting: Family

## 2016-02-28 ENCOUNTER — Encounter: Payer: Self-pay | Admitting: Family

## 2016-02-28 DIAGNOSIS — Z23 Encounter for immunization: Secondary | ICD-10-CM

## 2016-02-28 DIAGNOSIS — F321 Major depressive disorder, single episode, moderate: Secondary | ICD-10-CM

## 2016-02-28 MED ORDER — DULOXETINE HCL 60 MG PO CPEP: 60.0000 mg | ORAL_CAPSULE | Freq: Every day | ORAL | 3 refills | Status: DC

## 2016-02-28 NOTE — Assessment & Plan Note (Signed)
Stable, continue cymbalta  ?

## 2016-02-28 NOTE — Progress Notes (Signed)
Subjective:    Patient ID: Chad Mejia, male    DOB: 1971-03-14, 45 y.o.   MRN: [id number]  HPI  Chad Mejia is a 45 yr old male who presents today for follow up of his depression. Last visit he showed continued weight gain and we transitioned him off of zoloft and on to cymbalta.  Reports that his mood has remained stable with this change.  Had one day when he had some anxiety at work and one day with nausea during the transition, but otherwise he has not had any obvious side effects.  He is not exercising. He has gained 1 pound since his last visit. He attributes this to mild constipation.   Wt Readings from Last 3 Encounters:  02/28/16 280 lb 12.8 oz (127.4 kg)  01/29/16 279 lb (126.6 kg)  10/28/15 274 lb 3.2 oz (124.4 kg)    Review of Systems See HPI  Past Medical History:  Diagnosis Date  . History of migraine   . Hyperlipidemia   . Hypertension      Social History   Social History  . Marital status: Single    Spouse name: N/A  . Number of children: N/A  . Years of education: N/A   Occupational History  . Not on file.   Social History Main Topics  . Smoking status: Passive Smoke Exposure - Never Smoker  . Smokeless tobacco: Never Used  . Alcohol use Yes     Comment: 1 beer a month  . Drug use: No  . Sexual activity: Not on file   Other Topics Concern  . 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     Single, no children   Lives with friend   Has a cat   Enjoys painting,writing, reading   Bachelors degree    Past Surgical History:  Procedure Laterality Date  . ELBOW SURGERY Left    "shattered elbow as a child"    Family History  Problem Relation Age of Onset  . Hyperlipidemia Mother   . Hypertension Mother   . Schizophrenia Maternal Aunt   . Arthritis Maternal Grandmother   . Drug abuse Sister     died from drug overdose, 1/2 sister    No Known Allergies  Current Outpatient Prescriptions on  File Prior to Visit  Medication Sig Dispense Refill  . amLODipine (NORVASC) 10 MG tablet Take 1 tablet (10 mg total) by mouth daily. 90 tablet 1  . benzoyl peroxide-erythromycin (BENZAMYCIN) gel Apply topically 2 (two) times daily. 23.3 g 5  . DULoxetine (CYMBALTA) 30 MG capsule 1 cap by mouth once daily for 1 week, then increase to 2 caps once daily 60 capsule 0  . lisinopril (PRINIVIL,ZESTRIL) 10 MG tablet Take 1 tablet (10 mg total) by mouth daily. 90 tablet 1   No current facility-administered medications on file prior to visit.     BP 124/74 (BP Location: Right Arm, Cuff Size: Normal)   Pulse 75   Temp 98.2 F (36.8 C) (Oral)   Resp 18   Ht 5\' 11"  (1.803 m)   Wt 280 lb 12.8 oz (127.4 kg)   SpO2 100% Comment: room air  BMI 39.16 kg/m       Objective:   Physical Exam  Constitutional: He is oriented to person, place, and time. He appears well-developed and well-nourished. No distress.  HENT:  Head: Normocephalic and atraumatic.  Cardiovascular: Normal rate and regular rhythm.   No  murmur heard. Pulmonary/Chest: Effort normal and breath sounds normal. No respiratory distress. He has no wheezes. He has no rales.  Musculoskeletal: He exhibits no edema.  Neurological: He is alert and oriented to person, place, and time.  Skin: Skin is warm and dry.  Psychiatric: He has a normal mood and affect. His behavior is normal. Thought content normal.          Assessment & Plan:  Flu shot today.

## 2016-02-28 NOTE — Assessment & Plan Note (Signed)
Patient was counseled on the importance of diet, exercise and weight loss.  15 min spent with pt today. >50% of this time was spent counseling patient on depression treatment, weight loss.

## 2016-02-28 NOTE — Progress Notes (Signed)
Pre visit review using our clinic review tool, if applicable. No additional management support is needed unless otherwise documented below in the visit note. 

## 2016-02-28 NOTE — Patient Instructions (Signed)
Continue cymbalta. Add in regular exercise.

## 2016-05-22 ENCOUNTER — Other Ambulatory Visit: Payer: Self-pay | Admitting: Family

## 2016-05-22 DIAGNOSIS — I10 Essential (primary) hypertension: Secondary | ICD-10-CM

## 2016-05-22 NOTE — Telephone Encounter (Signed)
Patient has an appointment 05/29/16.

## 2016-05-29 ENCOUNTER — Ambulatory Visit (INDEPENDENT_AMBULATORY_CARE_PROVIDER_SITE_OTHER): Payer: BLUE CROSS/BLUE SHIELD | Admitting: Family

## 2016-05-29 ENCOUNTER — Encounter: Payer: Self-pay | Admitting: Family

## 2016-05-29 DIAGNOSIS — I10 Essential (primary) hypertension: Secondary | ICD-10-CM

## 2016-05-29 DIAGNOSIS — F321 Major depressive disorder, single episode, moderate: Secondary | ICD-10-CM | POA: Diagnosis not present

## 2016-05-29 LAB — BASIC METABOLIC PANEL
BUN: 10 mg/dL (ref 6–23)
CALCIUM: 9.4 mg/dL (ref 8.4–10.5)
CO2: 27 mEq/L (ref 19–32)
Chloride: 104 mEq/L (ref 96–112)
Creatinine, Ser: 0.94 mg/dL (ref 0.40–1.50)
GFR: 92.46 mL/min (ref >60.00)
GLUCOSE: 105 mg/dL — AB (ref 70–99)
Potassium: 4.2 mEq/L (ref 3.5–5.1)
Sodium: 138 mEq/L (ref 135–145)

## 2016-05-29 MED ORDER — DULOXETINE HCL 60 MG PO CPEP: 60.0000 mg | ORAL_CAPSULE | Freq: Every day | ORAL | 1 refills | Status: DC

## 2016-05-29 MED ORDER — AMLODIPINE BESYLATE 10 MG PO TABS: 10.0000 mg | ORAL_TABLET | Freq: Every day | ORAL | 1 refills | Status: DC

## 2016-05-29 MED ORDER — BENZOYL PEROXIDE-ERYTHROMYCIN 5-3 % EX GEL: Freq: Two times a day (BID) | CUTANEOUS | 5 refills | Status: DC

## 2016-05-29 NOTE — Patient Instructions (Signed)
Please complete lab work prior to leaving.   

## 2016-05-29 NOTE — Assessment & Plan Note (Signed)
BP stable on current medications. Continue same. Obtain follow up bmet.  

## 2016-05-29 NOTE — Progress Notes (Signed)
Subjective:    Patient ID: Chad Mejia, male    DOB: 23-Apr-1971, 45 y.o.   MRN: [id number]  HPI  Chad Mejia is a 45 yr old male who presents today for follow up of his depression.  Last visit he reported doing well with transition to cymbalta from zoloft.  Pt reports mood has been good.  Anxiety is improved.  Not exercising much.    Wt Readings from Last 3 Encounters:  05/29/16 279 lb 6.4 oz (126.7 kg)  02/28/16 280 lb 12.8 oz (127.4 kg)  01/29/16 279 lb (126.6 kg)   HTN- pt continues amlodipine and lisinopril.   Review of Systems  Respiratory: Negative for shortness of breath.   Cardiovascular: Negative for chest pain and leg swelling.   Past Medical History:  Diagnosis Date  . History of migraine   . Hyperlipidemia   . Hypertension      Social History   Social History  . Marital status: Single    Spouse name: N/A  . Number of children: N/A  . Years of education: N/A   Occupational History  . Not on file.   Social History Main Topics  . Smoking status: Passive Smoke Exposure - Never Smoker  . Smokeless tobacco: Never Used  . Alcohol use Yes     Comment: 1 beer a month  . Drug use: No  . Sexual activity: Not on file   Other Topics Concern  . 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     Single, no children   Lives with friend   Has a cat   Enjoys painting,writing, reading   Bachelors degree    Past Surgical History:  Procedure Laterality Date  . ELBOW SURGERY Left    "shattered elbow as a child"    Family History  Problem Relation Age of Onset  . Hyperlipidemia Mother   . Hypertension Mother   . Schizophrenia Maternal Aunt   . Arthritis Maternal Grandmother   . Drug abuse Sister     died from drug overdose, 1/2 sister    No Known Allergies  Current Outpatient Prescriptions on File Prior to Visit  Medication Sig Dispense Refill  . amLODipine (NORVASC) 10 MG tablet Take 1 tablet (10 mg  total) by mouth daily. 90 tablet 1  . benzoyl peroxide-erythromycin (BENZAMYCIN) gel Apply topically 2 (two) times daily. 23.3 g 5  . DULoxetine (CYMBALTA) 60 MG capsule Take 1 capsule (60 mg total) by mouth daily. 30 capsule 3  . lisinopril (PRINIVIL,ZESTRIL) 10 MG tablet TAKE 1 TABLET(10 MG) BY MOUTH DAILY 90 tablet 0   No current facility-administered medications on file prior to visit.     BP 131/88 (BP Location: Right Arm, Cuff Size: Large)   Pulse 67   Temp 97.9 F (36.6 C) (Oral)   Resp 16   Ht 5\' 11"  (1.803 m)   Wt 279 lb 6.4 oz (126.7 kg)   SpO2 99% Comment: room air  BMI 38.97 kg/m       Objective:   Physical Exam  Constitutional: He is oriented to person, place, and time. He appears well-developed and well-nourished. No distress.  HENT:  Head: Normocephalic and atraumatic.  Cardiovascular: Normal rate and regular rhythm.   No murmur heard. Pulmonary/Chest: Effort normal and breath sounds normal. No respiratory distress. He has no wheezes. He has no rales.  Musculoskeletal: He exhibits no edema.  Neurological: He is alert and oriented to person,  place, and time.  Skin: Skin is warm and dry.  Mild facial acne  Psychiatric: He has a normal mood and affect. His behavior is normal. Thought content normal.          Assessment & Plan:

## 2016-05-29 NOTE — Progress Notes (Signed)
Pre visit review using our clinic review tool, if applicable. No additional management support is needed unless otherwise documented below in the visit note. 

## 2016-05-29 NOTE — Assessment & Plan Note (Signed)
Stable on cymbalta. Continue same.  We discussed trying to add in some regular exercise.

## 2016-07-06 ENCOUNTER — Encounter: Payer: Self-pay | Admitting: Family

## 2016-08-20 ENCOUNTER — Other Ambulatory Visit: Payer: Self-pay | Admitting: Family

## 2016-08-20 DIAGNOSIS — I10 Essential (primary) hypertension: Secondary | ICD-10-CM

## 2016-08-28 ENCOUNTER — Encounter: Payer: Self-pay | Admitting: Family

## 2016-08-28 ENCOUNTER — Telehealth: Payer: Self-pay | Admitting: Family

## 2016-08-28 ENCOUNTER — Ambulatory Visit (INDEPENDENT_AMBULATORY_CARE_PROVIDER_SITE_OTHER): Payer: BLUE CROSS/BLUE SHIELD | Admitting: Family

## 2016-08-28 VITALS — BP 131/77 | HR 80 | Temp 98.0°F | Resp 16 | Ht 71.0 in | Wt 283.0 lb

## 2016-08-28 DIAGNOSIS — I10 Essential (primary) hypertension: Secondary | ICD-10-CM | POA: Diagnosis not present

## 2016-08-28 DIAGNOSIS — N419 Inflammatory disease of prostate, unspecified: Secondary | ICD-10-CM

## 2016-08-28 DIAGNOSIS — F321 Major depressive disorder, single episode, moderate: Secondary | ICD-10-CM | POA: Diagnosis not present

## 2016-08-28 DIAGNOSIS — L709 Acne, unspecified: Secondary | ICD-10-CM | POA: Diagnosis not present

## 2016-08-28 DIAGNOSIS — R739 Hyperglycemia, unspecified: Secondary | ICD-10-CM

## 2016-08-28 DIAGNOSIS — R350 Frequency of micturition: Secondary | ICD-10-CM

## 2016-08-28 LAB — URINALYSIS, ROUTINE W REFLEX MICROSCOPIC
BILIRUBIN URINE: NEGATIVE
Ketones, ur: NEGATIVE
LEUKOCYTES UA: NEGATIVE
Nitrite: NEGATIVE
Specific Gravity, Urine: >=1.03 — AB (ref 1.000–1.030)
Total Protein, Urine: NEGATIVE
UROBILINOGEN UA: 0.2 (ref 0.0–1.0)
Urine Glucose: NEGATIVE
pH: 6 (ref 5.0–8.0)

## 2016-08-28 LAB — BASIC METABOLIC PANEL
BUN: 13 mg/dL (ref 6–23)
CHLORIDE: 101 meq/L (ref 96–112)
CO2: 27 mEq/L (ref 19–32)
CREATININE: 1.03 mg/dL (ref 0.40–1.50)
Calcium: 9.8 mg/dL (ref 8.4–10.5)
GFR: 83.11 mL/min (ref >60.00)
Glucose, Bld: 97 mg/dL (ref 70–99)
Potassium: 3.6 mEq/L (ref 3.5–5.1)
Sodium: 135 mEq/L (ref 135–145)

## 2016-08-28 LAB — HEMOGLOBIN A1C: Hgb A1c MFr Bld: 5.9 % (ref 4.6–6.5)

## 2016-08-28 LAB — PSA: PSA: 1.1 ng/mL (ref 0.10–4.00)

## 2016-08-28 MED ORDER — AMLODIPINE BESYLATE 10 MG PO TABS: 10.0000 mg | ORAL_TABLET | Freq: Every day | ORAL | 1 refills | Status: DC

## 2016-08-28 MED ORDER — DULOXETINE HCL 60 MG PO CPEP: 60.0000 mg | ORAL_CAPSULE | Freq: Every day | ORAL | 1 refills | Status: DC

## 2016-08-28 MED ORDER — LISINOPRIL 10 MG PO TABS: ORAL_TABLET | ORAL | 0 refills | Status: DC

## 2016-08-28 MED ORDER — CIPROFLOXACIN HCL 500 MG PO TABS: 500.0000 mg | ORAL_TABLET | Freq: Two times a day (BID) | ORAL | 0 refills | Status: DC

## 2016-08-28 NOTE — Assessment & Plan Note (Signed)
Stable on cymbalta. We discussed his sexual side effects. He does not feel that they are severe enough to discontinue the cymbalta.  Monitor.

## 2016-08-28 NOTE — Assessment & Plan Note (Signed)
Stable, continue benzamycin gel.

## 2016-08-28 NOTE — Telephone Encounter (Signed)
Sugar is in the pre-diabetic range.  Please work on [employer], exercise, weight loss.

## 2016-08-28 NOTE — Progress Notes (Signed)
Subjective:    Patient ID: Chad Mejia, male    DOB: 1971-01-05, 46 y.o.   MRN: [id number]  HPI  [name] is a 46 yr old male who presents today for follow up.  1) HTN- The patient is maintained on amlodipine 10mg  once daily as well as lisinopril 10mg . Denies swelling, cp or sob.    BP Readings from Last 3 Encounters:  08/28/16 131/77  05/29/16 131/88  02/28/16 124/74   2) Depression- currently maintained on cymbalta.  contineus to have some work stress.  Overall stable.  Wt Readings from Last 3 Encounters:  08/28/16 283 lb (128.4 kg)  05/29/16 279 lb 6.4 oz (126.7 kg)  02/28/16 280 lb 12.8 oz (127.4 kg)   3) Acne- uses benzamycin gel.    4) urinary problem- reports a generally weak flow.  Reports some sharp pains during ejaculation. Denies dysuria.  Does have some nocturia x 1.  Denies fever or blood in the urine.     Review of Systems See HPI  Past Medical History:  Diagnosis Date  . History of migraine   . Hyperlipidemia   . Hypertension      Social History   Social History  . Marital status: Single    Spouse name: N/A  . Number of children: N/A  . Years of education: N/A   Occupational History  . Not on file.   Social History Main Topics  . Smoking status: Passive Smoke Exposure - Never Smoker  . Smokeless tobacco: Never Used  . Alcohol use Yes     Comment: 1 beer a month  . Drug use: No  . Sexual activity: Not on file   Other Topics Concern  . 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     Single, no children   Lives with friend   Has a cat   Enjoys painting,writing, reading   Bachelors degree    Past Surgical History:  Procedure Laterality Date  . ELBOW SURGERY Left    "shattered elbow as a child"    Family History  Problem Relation Age of Onset  . Hyperlipidemia Mother   . Hypertension Mother   . Schizophrenia Maternal Aunt   . Arthritis Maternal Grandmother   . Drug abuse  Sister     died from drug overdose, 1/2 sister    No Known Allergies  Current Outpatient Prescriptions on File Prior to Visit  Medication Sig Dispense Refill  . amLODipine (NORVASC) 10 MG tablet Take 1 tablet (10 mg total) by mouth daily. 90 tablet 1  . benzoyl peroxide-erythromycin (BENZAMYCIN) gel Apply topically 2 (two) times daily. 23.3 g 5  . DULoxetine (CYMBALTA) 60 MG capsule Take 1 capsule (60 mg total) by mouth daily. 90 capsule 1  . lisinopril (PRINIVIL,ZESTRIL) 10 MG tablet TAKE 1 TABLET(10 MG) BY MOUTH DAILY 90 tablet 0   No current facility-administered medications on file prior to visit.     BP 131/77 (BP Location: Right Arm, Cuff Size: Large)   Pulse 80   Temp 98 F (36.7 C) (Oral)   Resp 16   Ht 5\' 11"  (1.803 m)   Wt 283 lb (128.4 kg)   SpO2 100% Comment: room air  BMI 39.47 kg/m       Objective:   Physical Exam  Constitutional: He is oriented to person, place, and time. He appears well-developed and well-nourished. No distress.  HENT:  Head: Normocephalic and atraumatic.  Cardiovascular:  Normal rate and regular rhythm.   No murmur heard. Pulmonary/Chest: Effort normal and breath sounds normal. No respiratory distress. He has no wheezes. He has no rales.  Musculoskeletal: He exhibits no edema.  Neurological: He is alert and oriented to person, place, and time.  Skin: Skin is warm and dry.  Some acne noted on forehead  Psychiatric: He has a normal mood and affect. His behavior is normal. Thought content normal.          Assessment & Plan:  Urinary frequency- check urine culture, PSA, urine GC/Chlamydia.  ? UTI, ? Prostatitis. Microscopic urine noted on UA. Will plan empiric rx for prostatitis with cipro. Case reviewed with Dr. Charlett Blake.

## 2016-08-28 NOTE — Telephone Encounter (Signed)
Left detailed message on cell voicemail and to call Monday to schedule f/u in 2 weeks.

## 2016-08-28 NOTE — Patient Instructions (Signed)
Please complete lab work prior to leaving. Continue current meds prior to leaving. Call if new/worsening symptoms.

## 2016-08-28 NOTE — Telephone Encounter (Signed)
Urine shows microscopic blood. PSA prostate test normal. I would like him to begin cipro to cover for UTI as well as prostatitis. X 2 weeks.  Then lets bring him back to the office for re-evaluation and to recheck his urine.

## 2016-08-28 NOTE — Progress Notes (Signed)
Pre visit review using our clinic review tool, if applicable. No additional management support is needed unless otherwise documented below in the visit note. 

## 2016-08-28 NOTE — Assessment & Plan Note (Signed)
bp stable on current medications. He plans to start walking regularly. We discussed his weight gain.

## 2016-08-29 LAB — GC/CHLAMYDIA PROBE AMP
CT PROBE, AMP APTIMA: NOT DETECTED
GC PROBE AMP APTIMA: NOT DETECTED

## 2016-08-30 LAB — URINE CULTURE: ORGANISM ID, BACTERIA: NO GROWTH

## 2016-08-31 NOTE — Telephone Encounter (Signed)
My chart message sent to pt.

## 2016-09-15 ENCOUNTER — Ambulatory Visit: Payer: BLUE CROSS/BLUE SHIELD | Admitting: Family

## 2016-09-21 ENCOUNTER — Encounter: Payer: Self-pay | Admitting: Family

## 2016-09-21 ENCOUNTER — Ambulatory Visit (INDEPENDENT_AMBULATORY_CARE_PROVIDER_SITE_OTHER): Payer: BLUE CROSS/BLUE SHIELD | Admitting: Family

## 2016-09-21 VITALS — BP 134/76 | HR 69 | Temp 98.3°F | Resp 18 | Ht 71.0 in | Wt 282.6 lb

## 2016-09-21 DIAGNOSIS — R3989 Other symptoms and signs involving the genitourinary system: Secondary | ICD-10-CM

## 2016-09-21 NOTE — Progress Notes (Signed)
Subjective:    Patient ID: Chad Mejia, male    DOB: Jan 24, 1971, 46 y.o.   MRN: [id number]  HPI  Chad Mejia is a 45 yr old male who presents today for follow up of his prostatitis.  Last visit he described weak flow.  Some hesitency. He was prescribed cipro for possible prostatitis.  Denies dysuria/frequency.  He is getting up 1-2 times at night.  Completed cipro without improvement.    Review of Systems    see HPI  Past Medical History:  Diagnosis Date  . History of migraine   . Hyperlipidemia   . Hypertension      Social History   Social History  . Marital status: Single    Spouse name: N/A  . Number of children: N/A  . Years of education: N/A   Occupational History  . Not on file.   Social History Main Topics  . Smoking status: Passive Smoke Exposure - Never Smoker  . Smokeless tobacco: Never Used  . Alcohol use Yes     Comment: 1 beer a month  . Drug use: No  . Sexual activity: Not on file   Other Topics Concern  . 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     Single, no children   Lives with friend   Has a cat   Enjoys painting,writing, reading   Bachelors degree    Past Surgical History:  Procedure Laterality Date  . ELBOW SURGERY Left    "shattered elbow as a child"    Family History  Problem Relation Age of Onset  . Hyperlipidemia Mother   . Hypertension Mother   . Schizophrenia Maternal Aunt   . Arthritis Maternal Grandmother   . Drug abuse Sister     died from drug overdose, 1/2 sister    No Known Allergies  Current Outpatient Prescriptions on File Prior to Visit  Medication Sig Dispense Refill  . amLODipine (NORVASC) 10 MG tablet Take 1 tablet (10 mg total) by mouth daily. 90 tablet 1  . benzoyl peroxide-erythromycin (BENZAMYCIN) gel Apply topically 2 (two) times daily. 23.3 g 5  . DULoxetine (CYMBALTA) 60 MG capsule Take 1 capsule (60 mg total) by mouth daily. 90 capsule 1  .  lisinopril (PRINIVIL,ZESTRIL) 10 MG tablet TAKE 1 TABLET(10 MG) BY MOUTH DAILY 90 tablet 0   No current facility-administered medications on file prior to visit.     BP 134/76 (BP Location: Right Arm, Cuff Size: Large)   Pulse 69   Temp 98.3 F (36.8 C) (Oral)   Resp 18   Ht 5\' 11"  (1.803 m)   Wt 282 lb 9.6 oz (128.2 kg)   SpO2 100% Comment: room air  BMI 39.41 kg/m    Objective:   Physical Exam  Constitutional: He is oriented to person, place, and time. He appears well-developed and well-nourished. No distress.  HENT:  Head: Normocephalic and atraumatic.  Cardiovascular: Normal rate and regular rhythm.   No murmur heard. Pulmonary/Chest: Effort normal and breath sounds normal. No respiratory distress. He has no wheezes. He has no rales.  Musculoskeletal: He exhibits no edema.  Neurological: He is alert and oriented to person, place, and time.  Skin: Skin is warm and dry.  Psychiatric: He has a normal mood and affect. His behavior is normal. Thought content normal.          Assessment & Plan:  Urinary problem- unchanged- will refer to urology for further  evaluation.

## 2016-09-21 NOTE — Patient Instructions (Signed)
You will be contacted about your referral to urology.

## 2016-09-21 NOTE — Progress Notes (Signed)
Pre visit review using our clinic review tool, if applicable. No additional management support is needed unless otherwise documented below in the visit note. 

## 2016-09-27 ENCOUNTER — Encounter: Payer: Self-pay | Admitting: Family

## 2016-09-28 NOTE — Telephone Encounter (Signed)
Looks like June follow up is for his anxiety/depression and last visit was f/u for prostatitis.  Do we cancel June appt and just have pt return in 6 months per last visit?

## 2016-12-02 ENCOUNTER — Ambulatory Visit: Payer: BLUE CROSS/BLUE SHIELD | Admitting: Family

## 2017-02-16 ENCOUNTER — Other Ambulatory Visit: Payer: Self-pay | Admitting: Family

## 2017-03-15 ENCOUNTER — Ambulatory Visit (INDEPENDENT_AMBULATORY_CARE_PROVIDER_SITE_OTHER): Payer: BLUE CROSS/BLUE SHIELD | Admitting: Family

## 2017-03-15 ENCOUNTER — Encounter: Payer: Self-pay | Admitting: Family

## 2017-03-15 VITALS — BP 132/78 | HR 66 | Temp 98.3°F | Resp 16 | Ht 71.0 in | Wt 289.8 lb

## 2017-03-15 DIAGNOSIS — Z23 Encounter for immunization: Secondary | ICD-10-CM | POA: Diagnosis not present

## 2017-03-15 DIAGNOSIS — F329 Major depressive disorder, single episode, unspecified: Secondary | ICD-10-CM | POA: Diagnosis not present

## 2017-03-15 DIAGNOSIS — I10 Essential (primary) hypertension: Secondary | ICD-10-CM

## 2017-03-15 DIAGNOSIS — F32A Depression, unspecified: Secondary | ICD-10-CM

## 2017-03-15 LAB — BASIC METABOLIC PANEL
BUN: 12 mg/dL (ref 6–23)
CALCIUM: 9.6 mg/dL (ref 8.4–10.5)
CO2: 26 mEq/L (ref 19–32)
Chloride: 103 mEq/L (ref 96–112)
Creatinine, Ser: 1.02 mg/dL (ref 0.40–1.50)
GFR: 83.84 mL/min (ref >60.00)
GLUCOSE: 99 mg/dL (ref 70–99)
POTASSIUM: 4.4 meq/L (ref 3.5–5.1)
Sodium: 138 mEq/L (ref 135–145)

## 2017-03-15 MED ORDER — DULOXETINE HCL 60 MG PO CPEP: 60.0000 mg | ORAL_CAPSULE | Freq: Every day | ORAL | 1 refills | Status: DC

## 2017-03-15 MED ORDER — AMLODIPINE BESYLATE 10 MG PO TABS: 10.0000 mg | ORAL_TABLET | Freq: Every day | ORAL | 1 refills | Status: DC

## 2017-03-15 NOTE — Progress Notes (Addendum)
Subjective:    Patient ID: Chad Mejia, male    DOB: 06-22-1970, 46 y.o.   MRN: [id number]  HPI  HTN- continues lisinopril, amlodipine.  Reports tolerating medications without difficulty.  BP Readings from Last 3 Encounters:  03/15/17 132/78  09/21/16 134/76  08/28/16 131/77   Depression- Reports that his mood is stable.  He does report that he has had a lot of financial stressors lately. He is tolerating Cymbalta without difficulty.  Reports resolution of his previous urological symptoms.  Therefore he cancelled his Urology appointment.   Review of Systems    see HPI  Past Medical History:  Diagnosis Date  . History of migraine   . Hyperlipidemia   . Hypertension      Social History   Social History  . Marital status: Single    Spouse name: N/A  . Number of children: N/A  . Years of education: N/A   Occupational History  . Not on file.   Social History Main Topics  . Smoking status: Passive Smoke Exposure - Never Smoker  . Smokeless tobacco: Never Used  . Alcohol use Yes     Comment: 1 beer a month  . Drug use: No  . Sexual activity: Not on file   Other Topics Concern  . 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     Single, no children   Lives with friend   Has a cat   Enjoys painting,writing, reading   Bachelors degree    Past Surgical History:  Procedure Laterality Date  . ELBOW SURGERY Left    "shattered elbow as a child"    Family History  Problem Relation Age of Onset  . Hyperlipidemia Mother   . Hypertension Mother   . Schizophrenia Maternal Aunt   . Arthritis Maternal Grandmother   . Drug abuse Sister        died from drug overdose, 1/2 sister    No Known Allergies  Current Outpatient Prescriptions on File Prior to Visit  Medication Sig Dispense Refill  . amLODipine (NORVASC) 10 MG tablet Take 1 tablet (10 mg total) by mouth daily. 90 tablet 1  . benzoyl peroxide-erythromycin  (BENZAMYCIN) gel Apply topically 2 (two) times daily. 23.3 g 5  . DULoxetine (CYMBALTA) 60 MG capsule Take 1 capsule (60 mg total) by mouth daily. 90 capsule 1  . lisinopril (PRINIVIL,ZESTRIL) 10 MG tablet TAKE 1 TABLET(10 MG) BY MOUTH DAILY 90 tablet 1   No current facility-administered medications on file prior to visit.     BP 132/78 (BP Location: Left Arm, Cuff Size: Large)   Pulse 66   Temp 98.3 F (36.8 C) (Oral)   Resp 16   Ht 5\' 11"  (1.803 m)   Wt 289 lb 12.8 oz (131.5 kg)   BMI 40.42 kg/m    Objective:   Physical Exam  Constitutional: He is oriented to person, place, and time. He appears well-developed and well-nourished. No distress.  HENT:  Head: Normocephalic and atraumatic.  Cardiovascular: Normal rate and regular rhythm.   No murmur heard. Pulmonary/Chest: Effort normal and breath sounds normal. No respiratory distress. He has no wheezes. He has no rales.  Musculoskeletal: He exhibits no edema.  Neurological: He is alert and oriented to person, place, and time.  Skin: Skin is warm and dry.  Psychiatric: He has a normal mood and affect. His behavior is normal. Thought content normal.  Assessment & Plan:  Depression-  scored 14 on PHQ-9. We discussed possibility of changing his medications. He is satisfied with where things are right now and feels that his depression is worsened by his current financial issues. He does not wish to make any medication changes at this time. Will monitor.  Hypertension-blood pressure is stable. Continue current medications. Will obtain follow-up basic metabolic panel.  Flu shot today.

## 2017-06-23 ENCOUNTER — Encounter: Payer: Self-pay | Admitting: Family

## 2017-06-23 ENCOUNTER — Ambulatory Visit: Payer: BLUE CROSS/BLUE SHIELD | Admitting: Family

## 2017-06-23 VITALS — BP 119/74 | HR 61 | Temp 97.8°F | Resp 16 | Ht 71.0 in | Wt 289.2 lb

## 2017-06-23 DIAGNOSIS — Z0000 Encounter for general adult medical examination without abnormal findings: Secondary | ICD-10-CM

## 2017-06-23 DIAGNOSIS — R739 Hyperglycemia, unspecified: Secondary | ICD-10-CM

## 2017-06-23 LAB — CBC WITH DIFFERENTIAL/PLATELET
BASOS PCT: 0.8 % (ref 0.0–3.0)
Basophils Absolute: 0 10*3/uL (ref 0.0–0.1)
EOS ABS: 0.1 10*3/uL (ref 0.0–0.7)
Eosinophils Relative: 2.3 % (ref 0.0–5.0)
HCT: 42.2 % (ref 39.0–52.0)
Hemoglobin: 14.1 g/dL (ref 13.0–17.0)
Lymphocytes Relative: 36.7 % (ref 12.0–46.0)
Lymphs Abs: 2.1 10*3/uL (ref 0.7–4.0)
MCHC: 33.4 g/dL (ref 30.0–36.0)
MCV: 87.2 fl (ref 78.0–100.0)
MONO ABS: 0.6 10*3/uL (ref 0.1–1.0)
Monocytes Relative: 10.3 % (ref 3.0–12.0)
NEUTROS ABS: 2.8 10*3/uL (ref 1.4–7.7)
Neutrophils Relative %: 49.9 % (ref 43.0–77.0)
PLATELETS: 324 10*3/uL (ref 150.0–400.0)
RBC: 4.84 Mil/uL (ref 4.22–5.81)
RDW: 13.4 % (ref 11.5–15.5)
WBC: 5.7 10*3/uL (ref 4.0–10.5)

## 2017-06-23 LAB — LIPID PANEL
CHOL/HDL RATIO: 4
Cholesterol: 179 mg/dL (ref 0–200)
HDL: 48.7 mg/dL (ref >39.00)
LDL CALC: 119 mg/dL — AB (ref 0–99)
NonHDL: 130.47
TRIGLYCERIDES: 55 mg/dL (ref 0.0–149.0)
VLDL: 11 mg/dL (ref 0.0–40.0)

## 2017-06-23 LAB — URINALYSIS, ROUTINE W REFLEX MICROSCOPIC
BILIRUBIN URINE: NEGATIVE
Ketones, ur: 15 — AB
Leukocytes, UA: NEGATIVE
NITRITE: NEGATIVE
PH: 5.5 (ref 5.0–8.0)
RBC / HPF: NONE SEEN (ref 0–?)
Specific Gravity, Urine: >=1.03 — AB (ref 1.000–1.030)
Total Protein, Urine: NEGATIVE
Urine Glucose: NEGATIVE
Urobilinogen, UA: 0.2 (ref 0.0–1.0)
WBC UA: NONE SEEN (ref 0–?)

## 2017-06-23 LAB — BASIC METABOLIC PANEL
BUN: 12 mg/dL (ref 6–23)
CHLORIDE: 103 meq/L (ref 96–112)
CO2: 27 meq/L (ref 19–32)
CREATININE: 1.02 mg/dL (ref 0.40–1.50)
Calcium: 9.7 mg/dL (ref 8.4–10.5)
GFR: 83.74 mL/min (ref >60.00)
Glucose, Bld: 107 mg/dL — ABNORMAL HIGH (ref 70–99)
Potassium: 4.5 mEq/L (ref 3.5–5.1)
Sodium: 137 mEq/L (ref 135–145)

## 2017-06-23 LAB — HEPATIC FUNCTION PANEL
ALT: 25 U/L (ref 0–53)
AST: 19 U/L (ref 0–37)
Albumin: 4.5 g/dL (ref 3.5–5.2)
Alkaline Phosphatase: 47 U/L (ref 39–117)
BILIRUBIN DIRECT: 0.2 mg/dL (ref 0.0–0.3)
BILIRUBIN TOTAL: 1 mg/dL (ref 0.2–1.2)
Total Protein: 7.1 g/dL (ref 6.0–8.3)

## 2017-06-23 LAB — HEMOGLOBIN A1C: HEMOGLOBIN A1C: 5.8 % (ref 4.6–6.5)

## 2017-06-23 LAB — TSH: TSH: 1.1 u[IU]/mL (ref 0.35–4.50)

## 2017-06-23 NOTE — Patient Instructions (Signed)
Please complete lab work prior to leaving. Continue to work on [employer], regular exercise and weight loss. Schedule a routine eye exam and dental exam/cleaning when you are able.

## 2017-06-23 NOTE — Progress Notes (Signed)
Subjective:    Patient ID: Chad Mejia, male    DOB: [birth year], 47 y.o.   MRN: [id number]  HPI  Patient presents today for complete physical.  Immunizations: tdap 2016, flu shot 03/15/17 Diet: working on improving his diet Exercise: walking some Vision: due Dental:  Due   Wt Readings from Last 3 Encounters:  06/23/17 289 lb 3.2 oz (131.2 kg)  03/15/17 289 lb 12.8 oz (131.5 kg)  09/21/16 282 lb 9.6 oz (128.2 kg)   Depression- stopped cymbalta, feels ok, does not wish to restart at this time.     23 Review of Systems  Constitutional: Negative for unexpected weight change.  HENT: Negative for rhinorrhea.   Eyes: Negative for visual disturbance.  Respiratory: Negative for cough.   Cardiovascular: Negative for leg swelling.  Gastrointestinal: Negative for blood in stool, constipation and diarrhea.  Genitourinary: Negative for dysuria and frequency.  Musculoskeletal:       Mild knee pain- attributes to weight, fell and caught himself on the left shoulder- almost all the way better  Skin: Negative for rash.  Neurological: Negative for headaches.  Hematological: Negative for adenopathy.  Psychiatric/Behavioral:       Feels like depression is stable   Past Medical History:  Diagnosis Date  . Depression   . History of migraine   . Hyperlipidemia   . Hypertension      Social History   Socioeconomic History  . Marital status: Single    Spouse name: Not on file  . Number of children: Not on file  . Years of education: Not on file  . Highest education level: Not on file  Social Needs  . Financial resource strain: Not on file  . Food insecurity - worry: Not on file  . Food insecurity - inability: Not on file  . Transportation needs - medical: Not on file  . Transportation needs - non-medical: Not on file  Occupational History  . Not on file  Tobacco Use  . Smoking status: Passive Smoke Exposure - Never Smoker  . Smokeless tobacco: Never Used  Substance and  Sexual Activity  . Alcohol use: Yes    Comment: 1 beer a month  . Drug use: No  . Sexual activity: Not on file  Other Topics Concern  . 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     Single, no children   Lives with friend   Has a cat   Enjoys painting,writing, reading   Bachelors degree    Past Surgical History:  Procedure Laterality Date  . ELBOW SURGERY Left    "shattered elbow as a child"    Family History  Problem Relation Age of Onset  . Hyperlipidemia Mother   . Hypertension Mother   . Arthritis Maternal Grandmother   . Schizophrenia Maternal Aunt   . Drug abuse Sister        died from drug overdose, 1/2 sister    No Known Allergies  Current Outpatient Medications on File Prior to Visit  Medication Sig Dispense Refill  . amLODipine (NORVASC) 10 MG tablet Take 1 tablet (10 mg total) by mouth daily. 90 tablet 1  . benzoyl peroxide-erythromycin (BENZAMYCIN) gel Apply topically 2 (two) times daily. 23.3 g 5  . DULoxetine (CYMBALTA) 60 MG capsule Take 1 capsule (60 mg total) by mouth daily. 90 capsule 1  . lisinopril (PRINIVIL,ZESTRIL) 10 MG tablet TAKE 1 TABLET(10 MG) BY MOUTH DAILY 90 tablet 1  No current facility-administered medications on file prior to visit.     BP 119/74 (BP Location: Left Arm, Cuff Size: Large)   Pulse 61   Temp 97.8 F (36.6 C) (Oral)   Resp 16   Ht 5\' 11"  (1.803 m) Comment: w/shoes  Wt 289 lb 3.2 oz (131.2 kg)   SpO2 100%   BMI 40.34 kg/m       Objective:   Physical Exam Physical Exam  Constitutional: He is oriented to person, place, and time. He appears well-developed and well-nourished. No distress. Obese white male HENT:  Head: Normocephalic and atraumatic.  Right Ear: Tympanic membrane and ear canal normal.  Left Ear: Tympanic membrane and ear canal normal.  Mouth/Throat: Oropharynx is clear and moist.  Eyes: Pupils are equal, round, and reactive to light. No scleral  icterus.  Neck: Normal range of motion. No thyromegaly present.  Cardiovascular: Normal rate and regular rhythm.   No murmur heard. Pulmonary/Chest: Effort normal and breath sounds normal. No respiratory distress. He has no wheezes. He has no rales. He exhibits no tenderness.  Abdominal: Soft. Bowel sounds are normal. He exhibits no distension and no mass. There is no tenderness. There is no rebound and no guarding.  Musculoskeletal: He exhibits no edema.  Lymphadenopathy:    He has no cervical adenopathy.  Neurological: He is alert and oriented to person, place, and time. He has normal patellar reflexes. He exhibits normal muscle tone. Coordination normal.  Skin: Skin is warm and dry. some facial acne is noted Psychiatric: He has a normal mood and affect. His behavior is normal. Judgment and thought content normal.           Assessment & Plan:          Assessment & Plan:  Preventative care- discussed healthy diet, regular exercise, weight loss. Advised pt to schedule routine eye exam and dental exam. EKG tracing is personally reviewed.  EKG notes NSR.  No acute changes. Obtain routine lab work.

## 2017-08-13 ENCOUNTER — Other Ambulatory Visit: Payer: Self-pay | Admitting: Family

## 2017-10-04 ENCOUNTER — Encounter: Payer: Self-pay | Admitting: Family

## 2017-12-22 ENCOUNTER — Ambulatory Visit: Payer: BLUE CROSS/BLUE SHIELD | Admitting: Family

## 2017-12-22 ENCOUNTER — Encounter: Payer: Self-pay | Admitting: Family

## 2017-12-22 VITALS — BP 103/69 | HR 64 | Temp 97.9°F | Resp 18 | Ht 71.0 in | Wt 279.2 lb

## 2017-12-22 DIAGNOSIS — R252 Cramp and spasm: Secondary | ICD-10-CM

## 2017-12-22 DIAGNOSIS — F418 Other specified anxiety disorders: Secondary | ICD-10-CM | POA: Diagnosis not present

## 2017-12-22 DIAGNOSIS — I10 Essential (primary) hypertension: Secondary | ICD-10-CM | POA: Diagnosis not present

## 2017-12-22 LAB — BASIC METABOLIC PANEL
BUN: 12 mg/dL (ref 6–23)
CALCIUM: 9.8 mg/dL (ref 8.4–10.5)
CO2: 28 meq/L (ref 19–32)
Chloride: 104 mEq/L (ref 96–112)
Creatinine, Ser: 1.12 mg/dL (ref 0.40–1.50)
GFR: 75 mL/min (ref >60.00)
GLUCOSE: 109 mg/dL — AB (ref 70–99)
POTASSIUM: 4.7 meq/L (ref 3.5–5.1)
SODIUM: 138 meq/L (ref 135–145)

## 2017-12-22 LAB — MAGNESIUM: MAGNESIUM: 2.2 mg/dL (ref 1.5–2.5)

## 2017-12-22 MED ORDER — VENLAFAXINE HCL ER 37.5 MG PO CP24: ORAL_CAPSULE | ORAL | 0 refills | Status: DC

## 2017-12-22 NOTE — Progress Notes (Signed)
Subjective:    Patient ID: Chad Mejia, male    DOB: [birth year], 47 y.o.   MRN: [id number]  HPI  [name] is a 47 yr old male who presents today for follow up.  1) Depression-  Not currently on an antidepressant. Reports that he has a lot of stress at work. Worries a lot about things.  Hoping for a new job.   2) HTN- patient is maintained on amlodipine and lisinopril.   BP Readings from Last 3 Encounters:  12/22/17 103/69  06/23/17 119/74  03/15/17 132/78   3) cramping- notes intermittent cramping in his fingers and toes for the past 2 months.    Review of Systems See HPI  Past Medical History:  Diagnosis Date  . Depression   . History of migraine   . Hyperlipidemia   . Hypertension      Social History   Socioeconomic History  . Marital status: Single    Spouse name: Not on file  . Number of children: Not on file  . Years of education: Not on file  . Highest education level: Not on file  Occupational History  . Not on file  Social Needs  . Financial resource strain: Not on file  . Food insecurity:    Worry: Not on file    Inability: Not on file  . Transportation needs:    Medical: Not on file    Non-medical: Not on file  Tobacco Use  . Smoking status: Passive Smoke Exposure - Never Smoker  . Smokeless tobacco: Never Used  Substance and Sexual Activity  . Alcohol use: Yes    Comment: 1 beer a month  . Drug use: No  . Sexual activity: Not on file  Lifestyle  . Physical activity:    Days per week: Not on file    Minutes per session: Not on file  . Stress: Not on file  Relationships  . Social connections:    Talks on phone: Not on file    Gets together: Not on file    Attends religious service: Not on file    Active member of club or organization: Not on file    Attends meetings of clubs or organizations: Not on file    Relationship status: Not on file  . Intimate partner violence:    Fear of current or ex partner: Not on file    Emotionally  abused: Not on file    Physically abused: Not on file    Forced sexual activity: Not on file  Other Topics Concern  . 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     Single, no children   Lives with friend   Has a cat   Enjoys painting,writing, reading   Bachelors degree    Past Surgical History:  Procedure Laterality Date  . ELBOW SURGERY Left    "shattered elbow as a child"    Family History  Problem Relation Age of Onset  . Hyperlipidemia Mother   . Hypertension Mother   . Arthritis Maternal Grandmother   . Schizophrenia Maternal Aunt   . Drug abuse Sister        died from drug overdose, 1/2 sister    No Known Allergies  Current Outpatient Medications on File Prior to Visit  Medication Sig Dispense Refill  . amLODipine (NORVASC) 10 MG tablet Take 1 tablet (10 mg total) by mouth daily. 90 tablet 1  . benzoyl peroxide-erythromycin (BENZAMYCIN) gel  Apply topically 2 (two) times daily. 23.3 g 5  . lisinopril (PRINIVIL,ZESTRIL) 10 MG tablet TAKE 1 TABLET(10 MG) BY MOUTH DAILY 90 tablet 1   No current facility-administered medications on file prior to visit.     BP 103/69 (BP Location: Left Arm, Cuff Size: Large)   Pulse 64   Temp 97.9 F (36.6 C) (Oral)   Resp 18   Ht 5\' 11"  (1.803 m)   Wt 279 lb 3.2 oz (126.6 kg)   SpO2 99%   BMI 38.94 kg/m       Objective:   Physical Exam  Constitutional: He is oriented to person, place, and time. He appears well-developed and well-nourished. No distress.  HENT:  Head: Normocephalic and atraumatic.  Cardiovascular: Normal rate and regular rhythm.  No murmur heard. Pulmonary/Chest: Effort normal and breath sounds normal. No respiratory distress. He has no wheezes. He has no rales.  Musculoskeletal: He exhibits no edema.  Neurological: He is alert and oriented to person, place, and time.  Skin: Skin is warm and dry.  Psychiatric: He has a normal mood and affect. His behavior  is normal. Thought content normal.          Assessment & Plan:  HTN- blood pressure is low today. Will plan d/c lisinopril, continue amlodipine. Repeat bp in 2 weeks.  Depression/anxiety- uncontrolled. Scored 14 on PHQ-9.  Had weight gain on zoloft. Had some sexual side effects on cymbalta. Will give trial of effexor to see if he can better tolerate.   Cramping, check bmet, magnesium.

## 2017-12-22 NOTE — Patient Instructions (Signed)
Please stop lisinopril. Start effexor 1 tab once daily for 3 days then increase to 2 tabs once daily.

## 2017-12-25 ENCOUNTER — Other Ambulatory Visit: Payer: Self-pay | Admitting: Family

## 2018-01-03 ENCOUNTER — Encounter: Payer: Self-pay | Admitting: Family

## 2018-01-05 ENCOUNTER — Ambulatory Visit (INDEPENDENT_AMBULATORY_CARE_PROVIDER_SITE_OTHER): Payer: BLUE CROSS/BLUE SHIELD | Admitting: Family Medicine

## 2018-01-05 VITALS — BP 116/62 | HR 66

## 2018-01-05 DIAGNOSIS — I10 Essential (primary) hypertension: Secondary | ICD-10-CM

## 2018-01-05 NOTE — Progress Notes (Signed)
Pre visit review using our clinic tool,if applicable. No additional management support is needed unless otherwise documented below in the visit note.   Pt here for Blood pressure check per order from Debbrah Alar NP.   Pt currently takes: Amlodipine 10 mg daily and was taken off Lisinopril 10 mg daily on last office visit on 12/22/2017.   Pt reports compliance with medication. No complaints voiced this visit.  BP today @ = 116/82 P = 66  Pt advised per Lamar Blinks M.D. Continue medications as ordered. Follow up with provider in 1 month. Patient states he has follow up visit scheduled.

## 2018-01-05 NOTE — Progress Notes (Signed)
I have reviewed note and discussed case with Vernie Shanks

## 2018-01-12 ENCOUNTER — Encounter: Payer: Self-pay | Admitting: Family

## 2018-01-12 MED ORDER — LISINOPRIL 10 MG PO TABS: 5.0000 mg | ORAL_TABLET | Freq: Every day | ORAL | 3 refills | Status: DC

## 2018-01-19 ENCOUNTER — Encounter: Payer: Self-pay | Admitting: Family

## 2018-01-19 NOTE — Telephone Encounter (Signed)
Contacted patient and advised him to continue current dose of blood pressure medication.  He reports that his symptoms of chest tightness and jaw pain occurred yesterday and occurred at rest.  They resolved on their own.  He denies any current chest pain jaw pain or left arm pain today.  I advised patient that should he have recurrence of the symptoms he should go directly to the emergency department.  Otherwise if no recurrent symptoms plan to see him back in the office on Monday as scheduled.  Patient verbalizes understanding.

## 2018-01-23 NOTE — Progress Notes (Signed)
Subjective:    Patient ID: Chad Mejia, male    DOB: 1970-11-15, 47 y.o.   MRN: [id number]  HPI  Patient is a 47 year old male who presents today for routine follow-up.  He was last seen in early July.  Hypertension- last visit we d/c'd lisinopril due to low bp and continued his amlodipine. He had some elevated home BP readings and is back on lisinopril 10mg  along with amlodipine 10mg .    BP Readings from Last 3 Encounters:  01/24/18 121/80  01/05/18 116/62  12/22/17 103/69    Depression/anxiety- last visit he scored 14 on PHQ-9.  We discussed initiation of effexor. He has not started yet due to feeling nervous about the medication.   Chest pain- on 8/7 he reached ou to [place] due to recent symptoms of chest tightness and jaw pain which occurred at rest and resolved on their own.  We advised him to go to directly to the ED if he develops recurrent chest pain.    Ingrown toenail- Right toe.   Review of Systems See HPI  Past Medical History:  Diagnosis Date  . Depression   . History of migraine   . Hyperlipidemia   . Hypertension      Social History   Socioeconomic History  . Marital status: Single    Spouse name: Not on file  . Number of children: Not on file  . Years of education: Not on file  . Highest education level: Not on file  Occupational History  . Not on file  Social Needs  . Financial resource strain: Not on file  . Food insecurity:    Worry: Not on file    Inability: Not on file  . Transportation needs:    Medical: Not on file    Non-medical: Not on file  Tobacco Use  . Smoking status: Passive Smoke Exposure - Never Smoker  . Smokeless tobacco: Never Used  Substance and Sexual Activity  . Alcohol use: Yes    Comment: 1 beer a month  . Drug use: No  . Sexual activity: Not on file  Lifestyle  . Physical activity:    Days per week: Not on file    Minutes per session: Not on file  . Stress: Not on file  Relationships  . Social connections:   Talks on phone: Not on file    Gets together: Not on file    Attends religious service: Not on file    Active member of club or organization: Not on file    Attends meetings of clubs or organizations: Not on file    Relationship status: Not on file  . Intimate partner violence:    Fear of current or ex partner: Not on file    Emotionally abused: Not on file    Physically abused: Not on file    Forced sexual activity: Not on file  Other Topics Concern  . 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     Single, no children   Lives with friend   Has a cat   Enjoys painting,writing, reading   Bachelors degree    Past Surgical History:  Procedure Laterality Date  . ELBOW SURGERY Left    "shattered elbow as a child"    Family History  Problem Relation Age of Onset  . Hyperlipidemia Mother   . Hypertension Mother   . Arthritis Maternal Grandmother   . Schizophrenia Maternal Aunt   . Drug  abuse Sister        died from drug overdose, 1/2 sister    No Known Allergies  Current Outpatient Medications on File Prior to Visit  Medication Sig Dispense Refill  . amLODipine (NORVASC) 10 MG tablet TAKE 1 TABLET(10 MG) BY MOUTH DAILY 90 tablet 1  . benzoyl peroxide-erythromycin (BENZAMYCIN) gel Apply topically 2 (two) times daily. 23.3 g 5  . lisinopril (PRINIVIL,ZESTRIL) 10 MG tablet Take 0.5 tablets (5 mg total) by mouth daily. 90 tablet 3  . venlafaxine XR (EFFEXOR XR) 37.5 MG 24 hr capsule 1 tab by mouth daily for 3 days, then increase to 2 tabs once daily 60 capsule 0   No current facility-administered medications on file prior to visit.     BP 121/80 (BP Location: Left Arm, Patient Position: Sitting, Cuff Size: Large)   Pulse 70   Temp 97.8 F (36.6 C) (Oral)   Resp 16   Ht 5\' 11"  (1.803 m)   Wt 276 lb 3.2 oz (125.3 kg)   SpO2 99%   BMI 38.52 kg/m       Objective:   Physical Exam  Constitutional: He is oriented to person,  place, and time. He appears well-developed and well-nourished. No distress.  HENT:  Head: Normocephalic and atraumatic.  Cardiovascular: Normal rate and regular rhythm.  No murmur heard. Pulmonary/Chest: Effort normal and breath sounds normal. No respiratory distress. He has no wheezes. He has no rales.  Musculoskeletal: He exhibits no edema.  Neurological: He is alert and oriented to person, place, and time.  Skin: Skin is warm and dry.  Psychiatric: His behavior is normal. Thought content normal.  Flat affect           Assessment & Plan:  Hx of chest pain- resolved. EKG tracing is personally reviewed.  EKG notes NSR.  No acute changes. Will refer to cardiology for additional evaluation. Pt is advised to  Go to ER if he develops recurrent chest/arm or jaw pain.   HTN-  BP stable, back on lisinopril and amlodipine, continue same.  Depression- remains uncontrolled. He is hoping to switch jobs. Will consider starting effexor and let me know if he decides to proceed.    Office Visit from 12/22/2017 in [facility] at [facility]  PHQ-9 Total Score  14     Paronychia/ingrown toenail- keflex, epsom salt soaks, podiatry referral.

## 2018-01-24 ENCOUNTER — Ambulatory Visit: Payer: BLUE CROSS/BLUE SHIELD | Admitting: Family

## 2018-01-24 ENCOUNTER — Encounter: Payer: Self-pay | Admitting: Family

## 2018-01-24 VITALS — BP 121/80 | HR 70 | Temp 97.8°F | Resp 16 | Ht 71.0 in | Wt 276.2 lb

## 2018-01-24 DIAGNOSIS — L600 Ingrowing nail: Secondary | ICD-10-CM | POA: Diagnosis not present

## 2018-01-24 DIAGNOSIS — L03032 Cellulitis of left toe: Secondary | ICD-10-CM

## 2018-01-24 DIAGNOSIS — F329 Major depressive disorder, single episode, unspecified: Secondary | ICD-10-CM

## 2018-01-24 DIAGNOSIS — I10 Essential (primary) hypertension: Secondary | ICD-10-CM

## 2018-01-24 DIAGNOSIS — R0789 Other chest pain: Secondary | ICD-10-CM | POA: Diagnosis not present

## 2018-01-24 DIAGNOSIS — F32A Depression, unspecified: Secondary | ICD-10-CM

## 2018-01-24 MED ORDER — LISINOPRIL 10 MG PO TABS: 10.0000 mg | ORAL_TABLET | Freq: Every day | ORAL | 1 refills | Status: DC

## 2018-01-24 MED ORDER — CEPHALEXIN 500 MG PO CAPS: 500.0000 mg | ORAL_CAPSULE | Freq: Three times a day (TID) | ORAL | 0 refills | Status: DC

## 2018-01-24 NOTE — Patient Instructions (Addendum)
Please begin keflex (antibiotic) for your ingrown toenail. Soap toe twice daily in epsom salt bath. Call if increased redness/pain/swelling. You should be contacted about your referral to podiatry.  Go to ER if you develop recurrent chest/arm or jaw pain.

## 2018-02-07 ENCOUNTER — Encounter: Payer: Self-pay | Admitting: *Deleted

## 2018-02-07 ENCOUNTER — Encounter: Payer: Self-pay | Admitting: Cardiology

## 2018-02-07 ENCOUNTER — Ambulatory Visit: Payer: BLUE CROSS/BLUE SHIELD | Admitting: Cardiology

## 2018-02-07 VITALS — BP 110/70 | HR 79 | Ht 70.0 in | Wt 276.4 lb

## 2018-02-07 DIAGNOSIS — E782 Mixed hyperlipidemia: Secondary | ICD-10-CM | POA: Diagnosis not present

## 2018-02-07 DIAGNOSIS — I10 Essential (primary) hypertension: Secondary | ICD-10-CM | POA: Diagnosis not present

## 2018-02-07 DIAGNOSIS — R0683 Snoring: Secondary | ICD-10-CM | POA: Insufficient documentation

## 2018-02-07 DIAGNOSIS — R0789 Other chest pain: Secondary | ICD-10-CM | POA: Diagnosis not present

## 2018-02-07 NOTE — Progress Notes (Signed)
Cardiology Consultation:    Date:  02/07/2018   ID:  Chad Mejia, DOB 23-Dec-1970, MRN [id number]  PCP:  Chad Alar, NP  Cardiologist:  Chad Campus, MD   Referring MD: Chad Alar, NP   Chief Complaint  Patient presents with  . Chest Pain  Atypical chest pain  History of Present Illness:    Chad Mejia is a 47 y.o. male who is being seen today for the evaluation of atypical chest pain at the request of Chad Alar, NP.  About 3 weeks ago after very stressful day at work he was coming back home he was driving a car started having heaviness in the chest with some pain in the left arm as well as pain in his left side of his neck he can home he lay down relax and about 15 minutes later pain was gone entire duration of the pain was about half an hour there is no shortness of breath no sweating associated with this sensation. About 3 years ago he had a stress test which was negative.  Since that time he does not do any exercises he does have a stressful job. His risk factor for coronary artery disease include hypertension dyslipidemia as well as an activity and sex.   Past Medical History:  Diagnosis Date  . Depression   . History of migraine   . Hyperlipidemia   . Hypertension     Past Surgical History:  Procedure Laterality Date  . ELBOW SURGERY Left    "shattered elbow as a child"    Current Medications: Current Meds  Medication Sig  . amLODipine (NORVASC) 10 MG tablet TAKE 1 TABLET(10 MG) BY MOUTH DAILY  . lisinopril (PRINIVIL,ZESTRIL) 10 MG tablet Take 1 tablet (10 mg total) by mouth daily.     Allergies:   Patient has no known allergies.   Social History   Socioeconomic History  . Marital status: Single    Spouse name: Not on file  . Number of children: Not on file  . Years of education: Not on file  . Highest education level: Not on file  Occupational History  . Not on file  Social Needs  . Financial resource strain: Not on  file  . Food insecurity:    Worry: Not on file    Inability: Not on file  . Transportation needs:    Medical: Not on file    Non-medical: Not on file  Tobacco Use  . Smoking status: Passive Smoke Exposure - Never Smoker  . Smokeless tobacco: Never Used  Substance and Sexual Activity  . Alcohol use: Yes    Comment: 1 beer a month  . Drug use: No  . Sexual activity: Not on file  Lifestyle  . Physical activity:    Days per week: Not on file    Minutes per session: Not on file  . Stress: Not on file  Relationships  . Social connections:    Talks on phone: Not on file    Gets together: Not on file    Attends religious service: Not on file    Active member of club or organization: Not on file    Attends meetings of clubs or organizations: Not on file    Relationship status: Not on file  Other Topics Concern  . 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     Single, no children   Lives with friend   Has a cat  Enjoys painting,writing, reading   Bachelors degree     Family History: The patient's family history includes Arthritis in his maternal grandmother; Drug abuse in his sister; Hyperlipidemia in his mother; Hypertension in his mother; Schizophrenia in his maternal aunt. ROS:   Please see the history of present illness.    All 14 point review of systems negative except as described per history of present illness.  EKGs/Labs/Other Studies Reviewed:    The following studies were reviewed today: EKG showed normal sinus rhythm normal P interval normal QS complex duration morphology no ST-T segment changes   Recent Labs: 06/23/2017: ALT 25; Hemoglobin 14.1; Platelets 324.0; TSH 1.10 12/22/2017: BUN 12; Creatinine, Ser 1.12; Magnesium 2.2; Potassium 4.7; Sodium 138  Recent Lipid Panel    Component Value Date/Time   CHOL 179 06/23/2017 0743   TRIG 55.0 06/23/2017 0743   HDL 48.70 06/23/2017 0743   CHOLHDL 4 06/23/2017 0743     VLDL 11.0 06/23/2017 0743   LDLCALC 119 (H) 06/23/2017 0743    Physical Exam:    VS:  BP 110/70   Pulse 79   Ht 5\' 10"  (1.778 m)   Wt 276 lb 6.4 oz (125.4 kg)   SpO2 99%   BMI 39.66 kg/m     Wt Readings from Last 3 Encounters:  02/07/18 276 lb 6.4 oz (125.4 kg)  01/24/18 276 lb 3.2 oz (125.3 kg)  12/22/17 279 lb 3.2 oz (126.6 kg)     GEN:  Well nourished, well developed in no acute distress HEENT: Normal NECK: No JVD; No carotid bruits LYMPHATICS: No lymphadenopathy CARDIAC: RRR, no murmurs, no rubs, no gallops RESPIRATORY:  Clear to auscultation without rales, wheezing or rhonchi  ABDOMEN: Soft, non-tender, non-distended MUSCULOSKELETAL:  No edema; No deformity  SKIN: Warm and dry NEUROLOGIC:  Alert and oriented x 3 PSYCHIATRIC:  Normal affect   ASSESSMENT:    1. Essential hypertension   2. Morbid obesity ([place])   3. Mixed hyperlipidemia   4. Atypical chest pain   5. Snoring    PLAN:    In order of problems listed above:  1. Atypical chest pain.  I asked him to have a stress test we will do this exercise treadmill stress test to look for any evidence of ischemia.  Until the time we have the test back I asked him to start taking one baby aspirin every single day. 2. Essential hypertension.  Blood pressure appears to be well controlled now he is on ACE inhibitor as well as calcium channel blocker which I will continue.  He does have mild swelling of lower extremities which could be related to Norvasc or potentially sleep apnea. 3. Snoring he described also excessive somnolence during the day.  I think he can benefit from doing a sleep study which would help with management of his blood pressure as well as improve his somnolence and fatigability during the day. 4. Mixed dyslipidemia we will try to get fasting lipid profile from primary care physician. 5. We talked about healthy lifestyle including exercises on the regular basis and good diet which should help [place] with  multiple problems that he got including weight loss as well as some anxiety and depression  See him back in my office in about 1 month or sooner if he get a problem   Medication Adjustments/Labs and Tests Ordered: Current medicines are reviewed at length with the patient today.  Concerns regarding medicines are outlined above.  No orders of the defined types were placed  in this encounter.  No orders of the defined types were placed in this encounter.   Signed, Park Liter, MD, [facility]. 02/07/2018 3:26 PM    [place]

## 2018-02-07 NOTE — Patient Instructions (Signed)
Medication Instructions:  Your physician recommends that you continue on your current medications as directed. Please refer to the Current Medication list given to you today.   Labwork: None  Testing/Procedures: Your physician has requested that you have an exercise tolerance test. For further information please visit [email or web address]. Please also follow instruction sheet, as given.  Your physician has recommended that you have a sleep study. This test records several body functions during sleep, including: brain activity, eye movement, oxygen and carbon dioxide blood levels, heart rate and rhythm, breathing rate and rhythm, the flow of air through your mouth and nose, snoring, body muscle movements, and chest and belly movement. You will be contacted to schedule this appointment.   Follow-Up: Your physician recommends that you schedule a follow-up appointment in: 1 month.  If you need a refill on your cardiac medications before your next appointment, please call your pharmacy.   Thank you for choosing [facility]! Robyne Peers, RN [phone]    Exercise Stress Electrocardiogram An exercise stress electrocardiogram is a test to check how blood flows to your heart. It is done to find areas of poor blood flow. You will need to walk on a treadmill for this test. The electrocardiogram will record your heartbeat when you are at rest and when you are exercising. What happens before the procedure?  Do not have drinks with caffeine or foods with caffeine for 24 hours before the test, or as told by your doctor. This includes coffee, tea (even decaf tea), sodas, chocolate, and cocoa.  Follow your doctor's instructions about eating and drinking before the test.  Ask your doctor what medicines you should or should not take before the test. Take your medicines with water unless told by your doctor not to.  If you use an inhaler, bring it with you to the test.  Bring a snack to  eat after the test.  Do not  smoke for 4 hours before the test.  Do not put lotions, powders, creams, or oils on your chest before the test.  Wear comfortable shoes and clothing. What happens during the procedure?  You will have patches put on your chest. Small areas of your chest may need to be shaved. Wires will be connected to the patches.  Your heart rate will be watched while you are resting and while you are exercising.  You will walk on the treadmill. The treadmill will slowly get faster to raise your heart rate.  The test will take about 1-2 hours. What happens after the procedure?  Your heart rate and blood pressure will be watched after the test.  You may return to your normal diet, activities, and medicines or as told by your doctor. This information is not intended to replace advice given to you by your health care provider. Make sure you discuss any questions you have with your health care provider. Document Released: 11/18/2007 Document Revised: 01/29/2016 Document Reviewed: 02/06/2013 Elsevier Interactive Patient Education  [employer].

## 2018-02-10 ENCOUNTER — Ambulatory Visit (INDEPENDENT_AMBULATORY_CARE_PROVIDER_SITE_OTHER): Payer: BLUE CROSS/BLUE SHIELD

## 2018-02-10 DIAGNOSIS — R0789 Other chest pain: Secondary | ICD-10-CM

## 2018-02-10 LAB — EXERCISE TOLERANCE TEST
CHL CUP RESTING HR STRESS: 85 {beats}/min
CSEPEW: 8.5 METS
Exercise duration (min): 7 min
Exercise duration (sec): 8 s
MPHR: 174 {beats}/min
Peak HR: 176 {beats}/min
Percent HR: 101 %
RPE: 16

## 2018-02-15 ENCOUNTER — Telehealth: Payer: Self-pay | Admitting: *Deleted

## 2018-02-15 NOTE — Telephone Encounter (Signed)
-----   Message from Austin Miles, RN sent at 02/09/2018 11:18 AM EDT ----- Regarding: Please precert Please precert this patient to have a sleep study due to snoring and excessive somnolence during the day per Dr. Agustin Cree. Thanks!

## 2018-02-15 NOTE — Telephone Encounter (Signed)
Staff message sent to Robyne Peers, RN [employer] denied in lab sleep study. Approved HST.ok to schedule.

## 2018-02-21 ENCOUNTER — Ambulatory Visit: Payer: Self-pay | Admitting: Podiatry

## 2018-03-01 ENCOUNTER — Other Ambulatory Visit: Payer: Self-pay | Admitting: *Deleted

## 2018-03-01 ENCOUNTER — Telehealth: Payer: Self-pay | Admitting: Emergency Medicine

## 2018-03-01 DIAGNOSIS — R0683 Snoring: Secondary | ICD-10-CM

## 2018-03-01 NOTE — Telephone Encounter (Signed)
Left message for patient to return call regarding appointment for home sleep study 10/2 1pm.

## 2018-03-10 ENCOUNTER — Ambulatory Visit: Payer: BLUE CROSS/BLUE SHIELD | Admitting: Cardiology

## 2018-03-16 ENCOUNTER — Encounter (HOSPITAL_BASED_OUTPATIENT_CLINIC_OR_DEPARTMENT_OTHER): Payer: BLUE CROSS/BLUE SHIELD

## 2018-03-22 ENCOUNTER — Ambulatory Visit: Payer: Self-pay | Admitting: Podiatry

## 2018-04-07 ENCOUNTER — Ambulatory Visit: Payer: BLUE CROSS/BLUE SHIELD | Admitting: Cardiology

## 2018-05-02 ENCOUNTER — Ambulatory Visit: Payer: BLUE CROSS/BLUE SHIELD | Admitting: Family

## 2018-05-02 ENCOUNTER — Encounter: Payer: Self-pay | Admitting: Family

## 2018-05-02 VITALS — BP 113/69 | HR 74 | Temp 98.3°F | Resp 16 | Ht 70.0 in | Wt 278.0 lb

## 2018-05-02 DIAGNOSIS — Z23 Encounter for immunization: Secondary | ICD-10-CM

## 2018-05-02 DIAGNOSIS — L709 Acne, unspecified: Secondary | ICD-10-CM

## 2018-05-02 DIAGNOSIS — R739 Hyperglycemia, unspecified: Secondary | ICD-10-CM | POA: Diagnosis not present

## 2018-05-02 DIAGNOSIS — I10 Essential (primary) hypertension: Secondary | ICD-10-CM

## 2018-05-02 LAB — BASIC METABOLIC PANEL
BUN: 12 mg/dL (ref 6–23)
CALCIUM: 9.7 mg/dL (ref 8.4–10.5)
CO2: 28 mEq/L (ref 19–32)
Chloride: 103 mEq/L (ref 96–112)
Creatinine, Ser: 1.04 mg/dL (ref 0.40–1.50)
GFR: 81.57 mL/min (ref >60.00)
Glucose, Bld: 96 mg/dL (ref 70–99)
Potassium: 4.5 mEq/L (ref 3.5–5.1)
SODIUM: 138 meq/L (ref 135–145)

## 2018-05-02 MED ORDER — AMLODIPINE BESYLATE 10 MG PO TABS: ORAL_TABLET | ORAL | 1 refills | Status: DC

## 2018-05-02 MED ORDER — LISINOPRIL 10 MG PO TABS: 10.0000 mg | ORAL_TABLET | Freq: Every day | ORAL | 1 refills | Status: DC

## 2018-05-02 MED ORDER — BENZOYL PEROXIDE-ERYTHROMYCIN 5-3 % EX GEL: Freq: Two times a day (BID) | CUTANEOUS | 5 refills | Status: DC

## 2018-05-02 NOTE — Progress Notes (Signed)
Subjective:    Patient ID: Chad Mejia, male    DOB: June 27, 1970, 47 y.o.   MRN: [id number]  HPI  Patient is a 47 yr old male who presents today for follow up.  Lab Results  Component Value Date   HGBA1C 5.8 06/23/2017     HTN- He is currently maintained on amlodipine 10mg  and lisinopril 10mg . Reports that most of his home home bp readings are usually in the 782'U systolic.  He reports no significant swelling.   BP Readings from Last 3 Encounters:  05/02/18 113/69  02/07/18 110/70  01/24/18 121/80   Acne-  Reports that he used the benzamycin gel previously.  This helped. Notes recent recurrence of his acne. Requesting refill.   Review of Systems    see HPI  Past Medical History:  Diagnosis Date  . Depression   . History of migraine   . Hyperlipidemia   . Hypertension      Social History   Socioeconomic History  . Marital status: Single    Spouse name: Not on file  . Number of children: Not on file  . Years of education: Not on file  . Highest education level: Not on file  Occupational History  . Not on file  Social Needs  . Financial resource strain: Not on file  . Food insecurity:    Worry: Not on file    Inability: Not on file  . Transportation needs:    Medical: Not on file    Non-medical: Not on file  Tobacco Use  . Smoking status: Passive Smoke Exposure - Never Smoker  . Smokeless tobacco: Never Used  Substance and Sexual Activity  . Alcohol use: Yes    Comment: 1 beer a month  . Drug use: No  . Sexual activity: Not on file  Lifestyle  . Physical activity:    Days per week: Not on file    Minutes per session: Not on file  . Stress: Not on file  Relationships  . Social connections:    Talks on phone: Not on file    Gets together: Not on file    Attends religious service: Not on file    Active member of club or organization: Not on file    Attends meetings of clubs or organizations: Not on file    Relationship status: Not on file  .  Intimate partner violence:    Fear of current or ex partner: Not on file    Emotionally abused: Not on file    Physically abused: Not on file    Forced sexual activity: Not on file  Other Topics Concern  . 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     Single, no children   Lives with friend   Has a cat   Enjoys painting,writing, reading   Bachelors degree    Past Surgical History:  Procedure Laterality Date  . ELBOW SURGERY Left    "shattered elbow as a child"    Family History  Problem Relation Age of Onset  . Hyperlipidemia Mother   . Hypertension Mother   . Arthritis Maternal Grandmother   . Schizophrenia Maternal Aunt   . Drug abuse Sister        died from drug overdose, 1/2 sister    No Known Allergies  Current Outpatient Medications on File Prior to Visit  Medication Sig Dispense Refill  . amLODipine (NORVASC) 10 MG tablet TAKE 1 TABLET(10 MG)  BY MOUTH DAILY 90 tablet 1  . benzoyl peroxide-erythromycin (BENZAMYCIN) gel Apply topically 2 (two) times daily. 23.3 g 5  . lisinopril (PRINIVIL,ZESTRIL) 10 MG tablet Take 1 tablet (10 mg total) by mouth daily. 90 tablet 1   No current facility-administered medications on file prior to visit.     BP 113/69 (BP Location: Left Arm, Patient Position: Sitting, Cuff Size: Large)   Pulse 74   Temp 98.3 F (36.8 C) (Oral)   Resp 16   Ht 5\' 10"  (1.778 m)   Wt 278 lb (126.1 kg)   SpO2 100%   BMI 39.89 kg/m    Objective:   Physical Exam  Constitutional: He is oriented to person, place, and time. He appears well-developed and well-nourished. No distress.  HENT:  Head: Normocephalic and atraumatic.  Cardiovascular: Normal rate and regular rhythm.  No murmur heard. Pulmonary/Chest: Effort normal and breath sounds normal. No respiratory distress. He has no wheezes. He has no rales.  Musculoskeletal: He exhibits no edema.  Neurological: He is alert and oriented to person,  place, and time.  Skin: Skin is warm and dry.  Facial acne noted.   Psychiatric: He has a normal mood and affect. His behavior is normal. Thought content normal.          Assessment & Plan:  HTN- BP stable. Continue current meds. Obtain follow up bmet.  Acne- uncontrolled. Restart benzamycin gel.  Hyperglycemia- last A1C 5.8. We discussed importance of diet/exercise/weight loss to decrease chance of progressing to diabetes.

## 2018-05-02 NOTE — Patient Instructions (Signed)
Please complete lab work prior to leaving.   

## 2018-07-15 ENCOUNTER — Other Ambulatory Visit: Payer: Self-pay

## 2018-07-15 ENCOUNTER — Emergency Department (HOSPITAL_COMMUNITY): Payer: BLUE CROSS/BLUE SHIELD

## 2018-07-15 ENCOUNTER — Encounter (HOSPITAL_COMMUNITY): Payer: Self-pay | Admitting: *Deleted

## 2018-07-15 ENCOUNTER — Emergency Department (HOSPITAL_COMMUNITY)
Admission: EM | Admit: 2018-07-15 | Discharge: 2018-07-16 | Disposition: A | Discharge status: Home / Self Care | Payer: BLUE CROSS/BLUE SHIELD | Attending: Emergency Medicine | Admitting: Emergency Medicine

## 2018-07-15 ENCOUNTER — Encounter: Payer: Self-pay | Admitting: Family

## 2018-07-15 DIAGNOSIS — R079 Chest pain, unspecified: Secondary | ICD-10-CM | POA: Diagnosis not present

## 2018-07-15 DIAGNOSIS — R0789 Other chest pain: Secondary | ICD-10-CM | POA: Diagnosis not present

## 2018-07-15 DIAGNOSIS — Z79899 Other long term (current) drug therapy: Secondary | ICD-10-CM | POA: Insufficient documentation

## 2018-07-15 DIAGNOSIS — Z733 Stress, not elsewhere classified: Secondary | ICD-10-CM | POA: Insufficient documentation

## 2018-07-15 DIAGNOSIS — F419 Anxiety disorder, unspecified: Secondary | ICD-10-CM | POA: Diagnosis not present

## 2018-07-15 DIAGNOSIS — I10 Essential (primary) hypertension: Secondary | ICD-10-CM | POA: Diagnosis not present

## 2018-07-15 DIAGNOSIS — R61 Generalized hyperhidrosis: Secondary | ICD-10-CM | POA: Insufficient documentation

## 2018-07-15 DIAGNOSIS — R0602 Shortness of breath: Secondary | ICD-10-CM | POA: Diagnosis not present

## 2018-07-15 DIAGNOSIS — Z7722 Contact with and (suspected) exposure to environmental tobacco smoke (acute) (chronic): Secondary | ICD-10-CM | POA: Insufficient documentation

## 2018-07-15 HISTORY — DX: Anxiety disorder, unspecified: F41.9

## 2018-07-15 LAB — BASIC METABOLIC PANEL
Anion gap: 8 (ref 5–15)
BUN: 14 mg/dL (ref 6–20)
CO2: 22 mmol/L (ref 22–32)
CREATININE: 0.97 mg/dL (ref 0.61–1.24)
Calcium: 9.4 mg/dL (ref 8.9–10.3)
Chloride: 107 mmol/L (ref 98–111)
GFR calc Af Amer: >60 mL/min (ref >60)
GFR calc non Af Amer: >60 mL/min (ref >60)
Glucose, Bld: 104 mg/dL — ABNORMAL HIGH (ref 70–99)
Potassium: 3.5 mmol/L (ref 3.5–5.1)
Sodium: 137 mmol/L (ref 135–145)

## 2018-07-15 LAB — CBC
HCT: 43.4 % (ref 39.0–52.0)
Hemoglobin: 14.4 g/dL (ref 13.0–17.0)
MCH: 29.2 pg (ref 26.0–34.0)
MCHC: 33.2 g/dL (ref 30.0–36.0)
MCV: 88 fL (ref 80.0–100.0)
Platelets: 327 10*3/uL (ref 150–400)
RBC: 4.93 MIL/uL (ref 4.22–5.81)
RDW: 12.8 % (ref 11.5–15.5)
WBC: 7.4 10*3/uL (ref 4.0–10.5)
nRBC: 0 % (ref 0.0–0.2)

## 2018-07-15 LAB — I-STAT TROPONIN, ED: Troponin i, poc: 0 ng/mL (ref 0.00–0.08)

## 2018-07-15 MED ORDER — SODIUM CHLORIDE 0.9% FLUSH: 3.0000 mL | Freq: Once | INTRAVENOUS | Status: DC

## 2018-07-15 NOTE — ED Triage Notes (Signed)
Pt stated 'I have a hx of anxiety but I've 4 episodes of chest pain since yesterday that feels like pressure and felt like I needed to use the BR.  My stomach didn't feel very off.  I talked to my doctor and she told me to come here.  During the 1st 2 attacks I felt SOB."

## 2018-07-15 NOTE — ED Notes (Signed)
Bed: WA11 Expected date:  Expected time:  Means of arrival:  Comments: Triage 1 

## 2018-07-15 NOTE — Telephone Encounter (Signed)
Reports that he has some substernal chest pressure currently.  Had some tightness in his shoulders yesterday. Notes some pain in his left forearm which he has had periodically. I have advised pt to go to the ER now for evaluation of his chest pain and to schedule an OV next week to see me in the office. Pt is agreeable to plan.

## 2018-07-15 NOTE — ED Provider Notes (Signed)
[facility] DEPT Provider Note   CSN: [id number] Arrival date & time: 07/15/18  2220     History   Chief Complaint Chief Complaint  Patient presents with  . Chest Pain    HPI Chad Mejia is a 48 y.o. male with history of hypertension, hyperlipidemia, anxiety, depression presenting to the emergency department with chief complaint of chest pain.  Patient reports that over the last 2 days he has 4 episodes of chest pain that he thinks are associated with anxiety.  The episodes last approximately 10 minutes.  He describes the chest pain as pressure located in the center of his chest.  It does not radiate.  He rates the pain 4 out of 10.  When the pain starts he takes aspirin.  It does not relieve his symptoms.  Patient reports increased stress at work as well as in his home life.  Patient reports associated diaphoresis.  Denies palpitations, nausea, vomiting, back pain, jaw pain, arm pain.    Past Medical History:  Diagnosis Date  . Anxiety   . Depression   . History of migraine   . Hyperlipidemia   . Hypertension     Patient Active Problem List   Diagnosis Date Noted  . Atypical chest pain 02/07/2018  . Snoring 02/07/2018  . Major depression 07/29/2015  . Preventative health care 04/29/2015  . Morbid obesity ([place]) 04/30/2014  . Somnolence 01/01/2014  . HTN (hypertension) 06/06/2013  . Acne 06/06/2013  . Hyperlipidemia 06/06/2013  . Hypertension 05/11/2011  . Routine adult health maintenance 05/11/2011  . GERD (gastroesophageal reflux disease) 04/26/2011    Past Surgical History:  Procedure Laterality Date  . ELBOW SURGERY Left    "shattered elbow as a child"        Home Medications    Prior to Admission medications   Medication Sig Start Date End Date Taking? Authorizing Provider  amLODipine (NORVASC) 10 MG tablet TAKE 1 TABLET(10 MG) BY MOUTH DAILY 05/02/18   Debbrah Alar, NP  benzoyl peroxide-erythromycin  [facility]) gel Apply topically 2 (two) times daily. 05/02/18   Debbrah Alar, NP  lisinopril (PRINIVIL,ZESTRIL) 10 MG tablet Take 1 tablet (10 mg total) by mouth daily. 05/02/18   Debbrah Alar, NP    Family History Family History  Problem Relation Age of Onset  . Hyperlipidemia Mother   . Hypertension Mother   . Arthritis Maternal Grandmother   . Schizophrenia Maternal Aunt   . Drug abuse Sister        died from drug overdose, 1/2 sister    Social History Social History   Tobacco Use  . Smoking status: Passive Smoke Exposure - Never Smoker  . Smokeless tobacco: Never Used  Substance Use Topics  . Alcohol use: Yes    Comment: rarely  . Drug use: No     Allergies   Patient has no known allergies.   Review of Systems Review of Systems  Constitutional: Positive for diaphoresis. Negative for chills and fever.  HENT: Negative for congestion, sinus pressure and sore throat.   Eyes: Negative for pain and visual disturbance.  Respiratory: Negative for chest tightness and shortness of breath.   Cardiovascular: Positive for chest pain. Negative for palpitations and leg swelling.  Gastrointestinal: Negative for abdominal pain, diarrhea, nausea and vomiting.  Genitourinary: Negative for difficulty urinating and hematuria.  Musculoskeletal: Negative for back pain and neck pain.  Skin: Negative for rash and wound.  Neurological: Negative for dizziness, syncope, weakness and headaches.  Physical Exam Updated Vital Signs BP (!) 143/95 (BP Location: Left Arm)   Pulse 78   Temp 98.7 F (37.1 C) (Oral)   Resp 16   Ht 5\' 11"  (1.803 m)   Wt 126.1 kg   SpO2 100%   BMI 38.77 kg/m   Physical Exam Vitals signs and nursing note reviewed.  Constitutional:      Appearance: He is not ill-appearing or toxic-appearing.  HENT:     Head: Normocephalic and atraumatic.     Nose: Nose normal.     Mouth/Throat:     Mouth: Mucous membranes are moist.     Pharynx:  Oropharynx is clear.  Eyes:     General: No scleral icterus.    Conjunctiva/sclera: Conjunctivae normal.     Comments: EOM grossly intact  Neck:     Musculoskeletal: Normal range of motion.  Cardiovascular:     Rate and Rhythm: Normal rate and regular rhythm.     Pulses: Normal pulses.          Radial pulses are 2+ on the right side and 2+ on the left side.     Heart sounds: Normal heart sounds.  Pulmonary:     Effort: Pulmonary effort is normal.     Breath sounds: Normal breath sounds.  Abdominal:     General: There is no distension.     Palpations: Abdomen is soft.     Tenderness: There is no abdominal tenderness.  Musculoskeletal: Normal range of motion.  Skin:    General: Skin is warm and dry.     Capillary Refill: Capillary refill takes less than 2 seconds.  Neurological:     Mental Status: He is alert. Mental status is at baseline.     Motor: No weakness.  Psychiatric:        Behavior: Behavior normal.      ED Treatments / Results  Labs (all labs ordered are listed, but only abnormal results are displayed) Labs Reviewed  BASIC METABOLIC PANEL - Abnormal; Notable for the following components:      Result Value   Glucose, Bld 104 (*)    All other components within normal limits  CBC  I-STAT TROPONIN, ED    EKG EKG Interpretation  Date/Time:  Friday July 15 2018 22:36:06 EST Ventricular Rate:  69 PR Interval:    QRS Duration: 105 QT Interval:  403 QTC Calculation: 432 R Axis:   53 Text Interpretation:  Sinus rhythm Low voltage, precordial leads NO STEMI No old tracing to compare Confirmed by Addison Lank [phone]) on 07/16/2018 12:01:46 AM   Radiology Dg Chest 2 View  Result Date: 07/15/2018 CLINICAL DATA:  Chest pain and shortness of breath. History of hypertension. Nonsmoker. EXAM: CHEST - 2 VIEW COMPARISON:  None. FINDINGS: Shallow inspiration. Normal heart size and pulmonary vascularity. No focal airspace disease or consolidation in the lungs. No  blunting of costophrenic angles. No pneumothorax. Mediastinal contours appear intact. IMPRESSION: No active cardiopulmonary disease. Electronically Signed   By: Lucienne Capers M.D.   On: 07/15/2018 23:16    Procedures Procedures (including critical care time)  Medications Ordered in ED Medications  sodium chloride flush (NS) 0.9 % injection 3 mL (has no administration in time range)     Initial Impression / Assessment and Plan / ED Course  I have reviewed the triage vital signs and the nursing notes.  Pertinent labs & imaging results that were available during my care of the patient were reviewed by me and considered  in my medical decision making (see chart for details).   Patient is asymptomatic at this time.  He talked to his PCP about these recent episodes and she suggested that he come to the emergency department to be evaluated.  Patient has history of anxiety but has not been prescribed medications for this.  He has an appointment scheduled with his PCP this week to discuss treatment options for anxiety. Lab work today is unremarkable including BMP, CBC. Troponin negative. I viewed his chest xray and it is negative for cardiopulmonary disease.  EKG shows sinus rhythm.  Given patient's reassuring work-up and already scheduled appointment with his PCP, I do not feel further work up is necessary at this time.   Pt's blood pressure was elevated while in the emergency department. Recommended he have it rechecked while at his pcp appointment this week.  Discussed strict ED return precautions. Pt and his spouse verbalized understanding of and is in agreement with this plan. Pt stable for discharge home at this time.    Final Clinical Impressions(s) / ED Diagnoses   Final diagnoses:  Nonspecific chest pain    ED Discharge Orders    None       Cherre Robins, PA-C 07/16/18 1006    Fatima Blank, MD 07/17/18 [phone]

## 2018-07-16 NOTE — Discharge Instructions (Signed)
You have been seen today for chest pain. Please read and follow all provided instructions. Return to the emergency room for worsening condition or new concerning symptoms.    1. Medications: Continue usual home medications Take medications as prescribed. Please review all of the medicines and only take them if you do not have an allergy to them.  2. Treatment: rest, drink plenty of fluids 3. Follow Up: Please follow up with your primary doctor in 2-5 days for discussion of your diagnoses and further evaluation after today's visit; Call today to arrange your follow up.  If you do not have a primary care doctor use the resource guide provided to find one;   ?

## 2018-08-02 ENCOUNTER — Telehealth: Payer: Self-pay | Admitting: Family

## 2018-08-02 ENCOUNTER — Ambulatory Visit: Payer: BLUE CROSS/BLUE SHIELD | Admitting: Family

## 2018-08-02 ENCOUNTER — Encounter: Payer: Self-pay | Admitting: Family

## 2018-08-02 VITALS — BP 118/82 | HR 70 | Temp 98.0°F | Resp 16 | Ht 70.0 in | Wt 272.4 lb

## 2018-08-02 DIAGNOSIS — F418 Other specified anxiety disorders: Secondary | ICD-10-CM | POA: Diagnosis not present

## 2018-08-02 DIAGNOSIS — B9789 Other viral agents as the cause of diseases classified elsewhere: Secondary | ICD-10-CM

## 2018-08-02 DIAGNOSIS — J069 Acute upper respiratory infection, unspecified: Secondary | ICD-10-CM

## 2018-08-02 DIAGNOSIS — R0789 Other chest pain: Secondary | ICD-10-CM

## 2018-08-02 DIAGNOSIS — I10 Essential (primary) hypertension: Secondary | ICD-10-CM

## 2018-08-02 MED ORDER — DULOXETINE HCL 30 MG PO CPEP: ORAL_CAPSULE | ORAL | 1 refills | Status: DC

## 2018-08-02 NOTE — Progress Notes (Signed)
Subjective:    Patient ID: Chad Mejia, male    DOB: 04/26/1971, 48 y.o.   MRN: [id number]  HPI  Patient presents today for ED follow up for chest pain on 07/15/18.  ED record is reviewed.  A chest x-ray was performed which was clear.  Troponin was negative.  Reports that Chad Mejia has been having episodes at work.  Symptoms include feeling light headed, face turns red,  tunnel vision.  Can have a few seconds of SOB. Reports that it takes about 10 minutes to feel better, but still "felt off for a few days." Reports 2-3 small episodes like this and 2-3 large ones.  Chad Mejia is feeling reassured that his chest pain evaluation was negative.  Also reports that his girlfriend thinks that Chad Mejia is "clinically depressed."    Reports that Chad Mejia had a recent URI which is overall improving but still has a lingering cough.   Review of Systems   See HPI  Past Medical History:  Diagnosis Date  . Anxiety   . Depression   . History of migraine   . Hyperlipidemia   . Hypertension      Social History   Socioeconomic History  . Marital status: Single    Spouse name: Not on file  . Number of children: Not on file  . Years of education: Not on file  . Highest education level: Not on file  Occupational History  . Not on file  Social Needs  . Financial resource strain: Not on file  . Food insecurity:    Worry: Not on file    Inability: Not on file  . Transportation needs:    Medical: Not on file    Non-medical: Not on file  Tobacco Use  . Smoking status: Passive Smoke Exposure - Never Smoker  . Smokeless tobacco: Never Used  Substance and Sexual Activity  . Alcohol use: Yes    Comment: rarely  . Drug use: No  . Sexual activity: Not on file  Lifestyle  . Physical activity:    Days per week: Not on file    Minutes per session: Not on file  . Stress: Not on file  Relationships  . Social connections:    Talks on phone: Not on file    Gets together: Not on file    Attends religious service: Not  on file    Active member of club or organization: Not on file    Attends meetings of clubs or organizations: Not on file    Relationship status: Not on file  . Intimate partner violence:    Fear of current or ex partner: Not on file    Emotionally abused: Not on file    Physically abused: Not on file    Forced sexual activity: Not on file  Other Topics Concern  . 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     Single, no children   Lives with friend   Has a cat   Enjoys painting,writing, reading   Bachelors degree    Past Surgical History:  Procedure Laterality Date  . ELBOW SURGERY Left    "shattered elbow as a child"    Family History  Problem Relation Age of Onset  . Hyperlipidemia Mother   . Hypertension Mother   . Arthritis Maternal Grandmother   . Schizophrenia Maternal Aunt   . Drug abuse Sister        died from drug overdose, 1/2  sister    No Known Allergies  Current Outpatient Medications on File Prior to Visit  Medication Sig Dispense Refill  . amLODipine (NORVASC) 10 MG tablet TAKE 1 TABLET(10 MG) BY MOUTH DAILY 90 tablet 1  . benzoyl peroxide-erythromycin (BENZAMYCIN) gel Apply topically 2 (two) times daily. 23.3 g 5  . lisinopril (PRINIVIL,ZESTRIL) 10 MG tablet Take 1 tablet (10 mg total) by mouth daily. 90 tablet 1   No current facility-administered medications on file prior to visit.     BP 118/82 (BP Location: Left Arm, Patient Position: Sitting, Cuff Size: Large)   Pulse 70   Temp 98 F (36.7 C) (Oral)   Resp 16   Ht 5\' 10"  (1.778 m)   Wt 272 lb 6.4 oz (123.6 kg)   SpO2 99%   BMI 39.09 kg/m       Objective:   Physical Exam Constitutional:      General: Chad Mejia is not in acute distress.    Appearance: Chad Mejia is well-developed.  HENT:     Head: Normocephalic and atraumatic.  Cardiovascular:     Rate and Rhythm: Normal rate and regular rhythm.     Heart sounds: No murmur.  Pulmonary:     Effort:  Pulmonary effort is normal. No respiratory distress.     Breath sounds: Normal breath sounds. No wheezing or rales.  Skin:    General: Skin is warm and dry.  Neurological:     Mental Status: Chad Mejia is alert and oriented to person, place, and time.  Psychiatric:        Behavior: Behavior normal.        Thought Content: Thought content normal.     Comments: Slightly flattened affect           Assessment & Plan:  Depression/anxiety-I think that his recent episodes represent panic attacks.  We have tried multiple medications in the past including Zoloft which caused weight gain.  Chad Mejia was also on Cymbalta which she reported to me Chad Mejia tolerated quite well.  Chad Mejia seems to think that Chad Mejia may have had some diarrhea on the Cymbalta.  Chad Mejia thinks the diarrhea started several months after Chad Mejia began Cymbalta.  I did not document this in any of my notes as Chad Mejia told me on each visit that Chad Mejia was tolerating without difficulty.  We also discussed that would be unusual begin having diarrhea several months into taking the medication.  We will give him another trial of Cymbalta and see how Chad Mejia does.  Chad Mejia is advised to let me know if Chad Mejia has any GI side effects.  Atypical chest pain-this is resolved.  ED work-up is negative.  Will monitor.  Hypertension-blood pressure stable on current medications, continue same.  Upper respiratory infection with cough-this is overall improving lungs clear we will continue to monitor.

## 2018-08-02 NOTE — Patient Instructions (Signed)
Please begin cymbalta one cap daily for 3 days then increase to 2 caps once daily.

## 2018-08-03 NOTE — Telephone Encounter (Signed)
Opened in error

## 2018-09-09 ENCOUNTER — Encounter: Payer: Self-pay | Admitting: Family

## 2018-09-12 NOTE — Telephone Encounter (Signed)
Please reach out to patient to schedule a virtual follow up visit.

## 2018-09-14 ENCOUNTER — Ambulatory Visit: Payer: BLUE CROSS/BLUE SHIELD | Admitting: Family

## 2018-09-16 ENCOUNTER — Ambulatory Visit (INDEPENDENT_AMBULATORY_CARE_PROVIDER_SITE_OTHER): Payer: BLUE CROSS/BLUE SHIELD | Admitting: Family

## 2018-09-16 ENCOUNTER — Other Ambulatory Visit: Payer: Self-pay

## 2018-09-16 DIAGNOSIS — F419 Anxiety disorder, unspecified: Secondary | ICD-10-CM | POA: Diagnosis not present

## 2018-09-16 MED ORDER — AMLODIPINE BESYLATE 10 MG PO TABS: ORAL_TABLET | ORAL | 1 refills | Status: DC

## 2018-09-16 MED ORDER — LISINOPRIL 10 MG PO TABS: 10.0000 mg | ORAL_TABLET | Freq: Every day | ORAL | 1 refills | Status: DC

## 2018-09-16 MED ORDER — BUSPIRONE HCL 7.5 MG PO TABS: 7.5000 mg | ORAL_TABLET | Freq: Two times a day (BID) | ORAL | 1 refills | Status: DC

## 2018-09-16 NOTE — Progress Notes (Signed)
Virtual Visit via Video Note  I connected with@ on 09/16/18 at  9:20 AM EDT by a video enabled telemedicine application and verified that I am speaking with the correct person using two identifiers.   I discussed the limitations of evaluation and management by telemedicine and the availability of in person appointments. The patient expressed understanding and agreed to proceed.  Only the patient and myself were on today's video visit. The patient was at home and I was in my office at the time of today's visit.   History of Present Illness:  Patient is a 48 year old male who presents today for follow-up.  We last saw him on August 02, 2018.  At that time he described symptoms of anxiety and panic attack which included feeling lightheaded and tunnel vision.  He sometimes also had some brief shortness of breath.  He had also recently been evaluated in the emergency department for atypical chest pain with a negative chest x-ray negative troponin.   He was given a prescription for Cymbalta to begin for both anxiety and depression. He reports that he did not start because he was afraid of adding a medication right now with so much going on (Covid Pandemic).  He denies current depression symptoms.  Mostly anxiety.  Reports that he gets anxious mainly now when he eats (mainly takeout).  He believes that he had weight gain the last time that he took cymbalta and notes that he has lost 8 pounds recently which he is pleased about.  Observations/Objective:  Gen: Awake, alert, no acute distress Resp: Breathing is even and non-labored Psych: calm/pleasant demeanor Neuro: Alert and Oriented x 3, + facial symmetry, speech is clear.  Assessment and Plan:  Anxiety- uncontrolled.  Advised pt on trial of buspar.  He is agreeable. Advised a 6 week follow up and to reach out sooner if problems/concerns.  Pt verbalized understanding.   A total of 15  minutes were spent counseling the patient today on anxiety  treatment.    Follow Up Instructions:    I discussed the assessment and treatment plan with the patient. The patient was provided an opportunity to ask questions and all were answered. The patient agreed with the plan and demonstrated an understanding of the instructions.   The patient was advised to call back or seek an in-person evaluation if the symptoms worsen or if the condition fails to improve as anticipated.    Nance Pear, NP

## 2018-10-26 ENCOUNTER — Encounter: Payer: Self-pay | Admitting: Family

## 2018-11-17 ENCOUNTER — Telehealth: Payer: Self-pay | Admitting: Family

## 2018-11-17 ENCOUNTER — Other Ambulatory Visit: Payer: Self-pay

## 2018-11-17 MED ORDER — BUSPIRONE HCL 7.5 MG PO TABS: 7.5000 mg | ORAL_TABLET | Freq: Two times a day (BID) | ORAL | 1 refills | Status: DC

## 2018-11-17 NOTE — Telephone Encounter (Signed)
Copied from [place] [id number]. Topic: Quick Communication - Rx Refill/Question >> Nov 17, 2018 10:39 AM Celene Kras A wrote: Medication:busPIRone (BUSPAR) 7.5 MG tablet   Has the patient contacted their pharmacy? Yes.   (Agent: If no, request that the patient contact the pharmacy for the refill.) (Agent: If yes, when and what did the pharmacy advise?)  Preferred Pharmacy (with phone number or street name): [facility], [place] - [address] [place] [address] Phone: [phone] Fax: [phone] Not a 24 hour pharmacy; exact hours not known.    Agent: Please be advised that RX refills may take up to 3 business days. We ask that you follow-up with your pharmacy.

## 2018-11-17 NOTE — Telephone Encounter (Signed)
Refill sent has appt 11-21-18

## 2018-11-21 ENCOUNTER — Encounter: Payer: Self-pay | Admitting: Family

## 2018-11-21 ENCOUNTER — Ambulatory Visit (INDEPENDENT_AMBULATORY_CARE_PROVIDER_SITE_OTHER): Payer: BC Managed Care – PPO | Admitting: Family

## 2018-11-21 ENCOUNTER — Other Ambulatory Visit: Payer: Self-pay

## 2018-11-21 DIAGNOSIS — L709 Acne, unspecified: Secondary | ICD-10-CM | POA: Diagnosis not present

## 2018-11-21 DIAGNOSIS — I10 Essential (primary) hypertension: Secondary | ICD-10-CM | POA: Diagnosis not present

## 2018-11-21 DIAGNOSIS — F419 Anxiety disorder, unspecified: Secondary | ICD-10-CM

## 2018-11-21 MED ORDER — BUSPIRONE HCL 7.5 MG PO TABS: 7.5000 mg | ORAL_TABLET | Freq: Two times a day (BID) | ORAL | 1 refills | Status: DC

## 2018-11-21 NOTE — Progress Notes (Signed)
Virtual Visit via Video Note  I connected with Chad Mejia on 11/21/18 at 10:20 AM EDT by a video enabled telemedicine application and verified that I am speaking with the correct person using two identifiers. This visit type was conducted due to national recommendations for restrictions regarding the COVID-19 Pandemic (e.g. social distancing).  This format is felt to be most appropriate for this patient at this time.   I discussed the limitations of evaluation and management by telemedicine and the availability of in person appointments. The patient expressed understanding and agreed to proceed.  Only the patient and myself were on today's video visit. The patient was at work and I was in my office at home at the time of today's visit.   History of Present Illness:  Patient is a 48 yr old male who presents today for follow up of his anxiety.    Last visit he noted increased anxiety symptoms.  We gave him a trial of buspar. He reports significant improvement in his anxiety symptoms and anger outbursts.  HTN- he also provides [place] with his home readings via mychart.   Reviewed readings and readings are at goal. Current BP meds include lisinopril 10mg  and amlodipine 10mg .  sbp 105-126. Reports that he continues to work on weight loss. Would like to discuss lowering his bp medication.   Acne- reports that his acne has been well controlled recently.     Past Medical History:  Diagnosis Date  . Anxiety   . Depression   . History of migraine   . Hyperlipidemia   . Hypertension      Social History   Socioeconomic History  . Marital status: Single    Spouse name: Not on file  . Number of children: Not on file  . Years of education: Not on file  . Highest education level: Not on file  Occupational History  . Not on file  Social Needs  . Financial resource strain: Not on file  . Food insecurity:    Worry: Not on file    Inability: Not on file  . Transportation needs:    Medical:  Not on file    Non-medical: Not on file  Tobacco Use  . Smoking status: Passive Smoke Exposure - Never Smoker  . Smokeless tobacco: Never Used  Substance and Sexual Activity  . Alcohol use: Yes    Comment: rarely  . Drug use: No  . Sexual activity: Not on file  Lifestyle  . Physical activity:    Days per week: Not on file    Minutes per session: Not on file  . Stress: Not on file  Relationships  . Social connections:    Talks on phone: Not on file    Gets together: Not on file    Attends religious service: Not on file    Active member of club or organization: Not on file    Attends meetings of clubs or organizations: Not on file    Relationship status: Not on file  . Intimate partner violence:    Fear of current or ex partner: Not on file    Emotionally abused: Not on file    Physically abused: Not on file    Forced sexual activity: Not on file  Other Topics Concern  . 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     Single, no children   Lives with friend   Has a cat   Enjoys painting,writing, reading  Bachelors degree    Past Surgical History:  Procedure Laterality Date  . ELBOW SURGERY Left    "shattered elbow as a child"    Family History  Problem Relation Age of Onset  . Hyperlipidemia Mother   . Hypertension Mother   . Arthritis Maternal Grandmother   . Schizophrenia Maternal Aunt   . Drug abuse Sister        died from drug overdose, 1/2 sister    No Known Allergies  Current Outpatient Medications on File Prior to Visit  Medication Sig Dispense Refill  . amLODipine (NORVASC) 10 MG tablet TAKE 1 TABLET(10 MG) BY MOUTH DAILY 90 tablet 1  . benzoyl peroxide-erythromycin (BENZAMYCIN) gel Apply topically 2 (two) times daily. 23.3 g 5  . lisinopril (PRINIVIL,ZESTRIL) 10 MG tablet Take 1 tablet (10 mg total) by mouth daily. 90 tablet 1   No current facility-administered medications on file prior to visit.      There were no vitals taken for this visit.   Observations/Objective:    Gen: Awake, alert, no acute distress Resp: Breathing is even and non-labored Psych: calm/pleasant demeanor Neuro: Alert and Oriented x 3, + facial symmetry, speech is clear.  Assessment and Plan:  HTN-stable on current meds. Will decrease lisinopril from 10mg  to 5mg  and have pt continue to monitor his readings at home and share with me via mychart.  Acne-stable, continue benzamycin gel.  Anxiety-reports improvement  Acne- Stable, continue benzamycin gel.  Follow Up Instructions:    I discussed the assessment and treatment plan with the patient. The patient was provided an opportunity to ask questions and all were answered. The patient agreed with the plan and demonstrated an understanding of the instructions.   The patient was advised to call back or seek an in-person evaluation if the symptoms worsen or if the condition fails to improve as anticipated.    Nance Pear, NP

## 2019-02-23 ENCOUNTER — Other Ambulatory Visit: Payer: Self-pay

## 2019-02-24 ENCOUNTER — Ambulatory Visit: Payer: BC Managed Care – PPO | Admitting: Family

## 2019-02-24 ENCOUNTER — Encounter: Payer: Self-pay | Admitting: Family

## 2019-02-24 VITALS — BP 108/70 | HR 67 | Temp 97.3°F | Resp 16 | Ht 70.0 in | Wt 259.0 lb

## 2019-02-24 DIAGNOSIS — F321 Major depressive disorder, single episode, moderate: Secondary | ICD-10-CM | POA: Diagnosis not present

## 2019-02-24 DIAGNOSIS — L709 Acne, unspecified: Secondary | ICD-10-CM | POA: Diagnosis not present

## 2019-02-24 DIAGNOSIS — F419 Anxiety disorder, unspecified: Secondary | ICD-10-CM | POA: Diagnosis not present

## 2019-02-24 DIAGNOSIS — I10 Essential (primary) hypertension: Secondary | ICD-10-CM | POA: Diagnosis not present

## 2019-02-24 DIAGNOSIS — Z23 Encounter for immunization: Secondary | ICD-10-CM | POA: Diagnosis not present

## 2019-02-24 LAB — BASIC METABOLIC PANEL
BUN: 12 mg/dL (ref 6–23)
CO2: 29 mEq/L (ref 19–32)
Calcium: 9.6 mg/dL (ref 8.4–10.5)
Chloride: 102 mEq/L (ref 96–112)
Creatinine, Ser: 1.06 mg/dL (ref 0.40–1.50)
GFR: 74.82 mL/min (ref >60.00)
Glucose, Bld: 100 mg/dL — ABNORMAL HIGH (ref 70–99)
Potassium: 4.3 mEq/L (ref 3.5–5.1)
Sodium: 137 mEq/L (ref 135–145)

## 2019-02-24 MED ORDER — AMLODIPINE BESYLATE 10 MG PO TABS: ORAL_TABLET | ORAL | 1 refills | Status: DC

## 2019-02-24 MED ORDER — LISINOPRIL 10 MG PO TABS: 5.0000 mg | ORAL_TABLET | Freq: Every day | ORAL | 1 refills | Status: DC

## 2019-02-24 MED ORDER — BENZOYL PEROXIDE-ERYTHROMYCIN 5-3 % EX GEL: Freq: Two times a day (BID) | CUTANEOUS | 5 refills | Status: AC

## 2019-02-24 MED ORDER — BUSPIRONE HCL 15 MG PO TABS: 15.0000 mg | ORAL_TABLET | Freq: Three times a day (TID) | ORAL | 3 refills | Status: DC

## 2019-02-24 NOTE — Progress Notes (Signed)
Subjective:    Patient ID: Chad Mejia, male    DOB: Oct 14, 1970, 48 y.o.   MRN: [id number]  HPI   Patient is a 48 yr old male who presents today for follow up.  HTN- maintained on amlodipine and lisinopril 1/2 tab.  Denies LE edema.   Denies CP or SOB.  BP Readings from Last 3 Encounters:  02/24/19 108/70  08/02/18 118/82  07/16/18 137/81   Depression/Anxiety-   Chronic anxiety- espeically at wor.  Feels like depression has "leveled."  Feels like his outlook is "dour" righ tnow but feels like everyone's outlook is "dour." Continues buspar. Still has anxiety attacks.    Acne- reports tha the had a "spell" 1 month ago which was bad. Using benzamycin gel.    Review of Systems See HPI  Past Medical History:  Diagnosis Date  . Anxiety   . Depression   . History of migraine   . Hyperlipidemia   . Hypertension      Social History   Socioeconomic History  . Marital status: Single    Spouse name: Not on file  . Number of children: Not on file  . Years of education: Not on file  . Highest education level: Not on file  Occupational History  . Not on file  Social Needs  . Financial resource strain: Not on file  . Food insecurity    Worry: Not on file    Inability: Not on file  . Transportation needs    Medical: Not on file    Non-medical: Not on file  Tobacco Use  . Smoking status: Passive Smoke Exposure - Never Smoker  . Smokeless tobacco: Never Used  Substance and Sexual Activity  . Alcohol use: Yes    Comment: rarely  . Drug use: No  . Sexual activity: Not on file  Lifestyle  . Physical activity    Days per week: Not on file    Minutes per session: Not on file  . Stress: Not on file  Relationships  . Social Herbalist on phone: Not on file    Gets together: Not on file    Attends religious service: Not on file    Active member of club or organization: Not on file    Attends meetings of clubs or organizations: Not on file    Relationship  status: Not on file  . Intimate partner violence    Fear of current or ex partner: Not on file    Emotionally abused: Not on file    Physically abused: Not on file    Forced sexual activity: Not on file  Other Topics Concern  . 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     Single, no children   Lives with friend   Has a cat   Enjoys painting,writing, reading   Bachelors degree    Past Surgical History:  Procedure Laterality Date  . ELBOW SURGERY Left    "shattered elbow as a child"    Family History  Problem Relation Age of Onset  . Hyperlipidemia Mother   . Hypertension Mother   . Arthritis Maternal Grandmother   . Schizophrenia Maternal Aunt   . Drug abuse Sister        died from drug overdose, 1/2 sister    No Known Allergies  No current outpatient medications on file prior to visit.   No current facility-administered medications on file prior to  visit.     BP 108/70 (BP Location: Left Arm, Patient Position: Sitting, Cuff Size: Large)   Pulse 67   Temp (!) 97.3 F (36.3 C) (Temporal)   Resp 16   Ht 5\' 10"  (1.778 m)   Wt 259 lb (117.5 kg)   SpO2 98%   BMI 37.16 kg/m       Objective:   Physical Exam Constitutional:      General: He is not in acute distress.    Appearance: He is well-developed.  HENT:     Head: Normocephalic and atraumatic.  Cardiovascular:     Rate and Rhythm: Normal rate and regular rhythm.     Heart sounds: No murmur.  Pulmonary:     Effort: Pulmonary effort is normal. No respiratory distress.     Breath sounds: Normal breath sounds. No wheezing or rales.  Skin:    General: Skin is warm and dry.     Comments: No acne noted on forehead, wearing mask   Neurological:     Mental Status: He is alert and oriented to person, place, and time.  Psychiatric:        Behavior: Behavior normal.        Thought Content: Thought content normal.           Assessment & Plan:  HTN- bp  stable. Home readings reviewed and are at goal. Continue current meds/doses. Obtain follow up bmet.   Anxiety- uncontrolled. Will increase buspar from 7.5mg  to 15mg  bid. I asked him to reach out to me in a few weeks via mychart to let me know how he is doing with this adjustment.  Depression- fair control. Monitor.   Acne- stable. Continue benzamycin gel prn.   Flu shot today

## 2019-02-24 NOTE — Patient Instructions (Signed)
Please complete lab work prior to leaving. Increase buspar to 15mg  twice daily. Send me a mychart message in a few weeks to let me know how you are doing on the increased dose of Buspar.

## 2019-02-28 ENCOUNTER — Encounter: Payer: Self-pay | Admitting: Family

## 2019-03-01 ENCOUNTER — Encounter: Payer: Self-pay | Admitting: Family

## 2019-05-25 ENCOUNTER — Encounter: Payer: Self-pay | Admitting: Family

## 2019-05-29 ENCOUNTER — Other Ambulatory Visit: Payer: Self-pay

## 2019-05-29 ENCOUNTER — Ambulatory Visit (INDEPENDENT_AMBULATORY_CARE_PROVIDER_SITE_OTHER): Payer: BLUE CROSS/BLUE SHIELD | Admitting: Family

## 2019-05-29 ENCOUNTER — Encounter: Payer: Self-pay | Admitting: Family

## 2019-05-29 DIAGNOSIS — F419 Anxiety disorder, unspecified: Secondary | ICD-10-CM

## 2019-05-29 DIAGNOSIS — I10 Essential (primary) hypertension: Secondary | ICD-10-CM | POA: Diagnosis not present

## 2019-05-29 DIAGNOSIS — Z0000 Encounter for general adult medical examination without abnormal findings: Secondary | ICD-10-CM | POA: Diagnosis not present

## 2019-05-29 MED ORDER — BUSPIRONE HCL 15 MG PO TABS: 15.0000 mg | ORAL_TABLET | Freq: Two times a day (BID) | ORAL | 5 refills | Status: DC

## 2019-05-29 MED ORDER — ALPRAZOLAM 0.5 MG PO TABS: 0.5000 mg | ORAL_TABLET | Freq: Two times a day (BID) | ORAL | 0 refills | Status: DC | PRN

## 2019-05-29 NOTE — Progress Notes (Signed)
Virtual Visit via Video Note  I connected with Chad Mejia on 05/29/19 at  7:00 AM EST by a video enabled telemedicine application and verified that I am speaking with the correct person using two identifiers.  Location: Patient: home Provider: home   I discussed the limitations of evaluation and management by telemedicine and the availability of in person appointments. The patient expressed understanding and agreed to proceed.  History of Present Illness:  Patient presents today for complete physical.  Immunizations: flu shot and tetanus up to date Diet: reports diet is fair, eating some christmas treats Wt Readings from Last 3 Encounters:  02/24/19 259 lb (117.5 kg)  08/02/18 272 lb 6.4 oz (123.6 kg)  07/15/18 278 lb (126.1 kg)  reports weight is about 255  Exercise: reports that he exercises a few times a week Vision: due Dental: needs to have wisdom teeth removed  ROS  Gen: weight stable Resp: denies cough/cold symptoms. CV:  Denies SOB, palpitations GU: denies dysuria, frequency, hematuria [id number] constipation, diarrhea, blood in the stool- occasional hemorrhoid flares MS: reports that his thumb joint "acts up sometimes."  Reports that it locks up sometimes Neuro:occasional tension headaches  Anxiety- last visit we increased his buspar from 7.5mg   to 15mg .  Reports that he is still having some anxiety attacks when it gets crazy at work.  Mood is "about the same."    HTN- reports that his bp was 124/64.  Past Medical History:  Diagnosis Date  . Anxiety   . Depression   . History of migraine   . Hyperlipidemia   . Hypertension      Social History   Socioeconomic History  . Marital status: Single    Spouse name: Not on file  . Number of children: Not on file  . Years of education: Not on file  . Highest education level: Not on file  Occupational History  . Not on file  Tobacco Use  . Smoking status: Passive Smoke Exposure - Never Smoker  .  Smokeless tobacco: Never Used  Substance and Sexual Activity  . Alcohol use: Yes    Comment: rarely  . Drug use: No  . Sexual activity: Not on file  Other Topics Concern  . 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     Single, no children   Lives with friend   Has a cat   Enjoys painting,writing, reading   Bachelors degree   Social Determinants of Health   Financial Resource Strain:   . Difficulty of Paying Living Expenses: Not on file  Food Insecurity:   . Worried About Charity fundraiser in the Last Year: Not on file  . Ran Out of Food in the Last Year: Not on file  Transportation Needs:   . Lack of Transportation (Medical): Not on file  . Lack of Transportation (Non-Medical): Not on file  Physical Activity:   . Days of Exercise per Week: Not on file  . Minutes of Exercise per Session: Not on file  Stress:   . Feeling of Stress : Not on file  Social Connections:   . Frequency of Communication with Friends and Family: Not on file  . Frequency of Social Gatherings with Friends and Family: Not on file  . Attends Religious Services: Not on file  . Active Member of Clubs or Organizations: Not on file  . Attends Archivist Meetings: Not on file  . Marital Status: Not  on file  Intimate Partner Violence:   . Fear of Current or Ex-Partner: Not on file  . Emotionally Abused: Not on file  . Physically Abused: Not on file  . Sexually Abused: Not on file    Past Surgical History:  Procedure Laterality Date  . ELBOW SURGERY Left    "shattered elbow as a child"    Family History  Problem Relation Age of Onset  . Hyperlipidemia Mother   . Hypertension Mother   . Arthritis Maternal Grandmother   . Schizophrenia Maternal Aunt   . Drug abuse Sister        died from drug overdose, 1/2 sister    No Known Allergies  Current Outpatient Medications on File Prior to Visit  Medication Sig Dispense Refill  . amLODipine  (NORVASC) 10 MG tablet TAKE 1 TABLET(10 MG) BY MOUTH DAILY 90 tablet 1  . benzoyl peroxide-erythromycin (BENZAMYCIN) gel Apply topically 2 (two) times daily. 23.3 g 5  . lisinopril (ZESTRIL) 10 MG tablet Take 0.5 tablets (5 mg total) by mouth daily. 45 tablet 1   No current facility-administered medications on file prior to visit.    There were no vitals taken for this visit.     Observations/Objective:   Gen: Awake, alert, no acute distress Resp: Breathing is even and non-labored Psych: calm/pleasant demeanor Neuro: Alert and Oriented x 3, + facial symmetry, speech is clear.   Assessment and Plan:  Preventative care- discussed diet/exercise and weight loss.  He will return to the lab for routine lab work. Immunizations reviewed and up to date.  HTN- bp stable on current medications- continue same.   Anxiety- uncontrolled, will add prn xanax.      Follow Up Instructions:    I discussed the assessment and treatment plan with the patient. The patient was provided an opportunity to ask questions and all were answered. The patient agreed with the plan and demonstrated an understanding of the instructions.   The patient was advised to call back or seek an in-person evaluation if the symptoms worsen or if the condition fails to improve as anticipated.  Nance Pear, NP

## 2019-05-30 ENCOUNTER — Encounter: Payer: BC Managed Care – PPO | Admitting: Family

## 2019-06-23 ENCOUNTER — Encounter: Payer: Self-pay | Admitting: Family

## 2019-06-26 NOTE — Telephone Encounter (Deleted)
Hi Chad Mejia, This is an old card. I was able to pull your new information from [employer]. The new ID # is [id number]. I have loaded it into the system for you. If you have a new card please bring it with you. If you do not, please contact [employer] for an updated card to be sent to you. Thank you.

## 2019-06-27 ENCOUNTER — Other Ambulatory Visit: Payer: Self-pay

## 2019-06-27 ENCOUNTER — Other Ambulatory Visit (INDEPENDENT_AMBULATORY_CARE_PROVIDER_SITE_OTHER): Payer: BLUE CROSS/BLUE SHIELD

## 2019-06-27 DIAGNOSIS — F419 Anxiety disorder, unspecified: Secondary | ICD-10-CM | POA: Diagnosis not present

## 2019-06-27 DIAGNOSIS — Z0000 Encounter for general adult medical examination without abnormal findings: Secondary | ICD-10-CM

## 2019-06-27 LAB — CBC WITH DIFFERENTIAL/PLATELET
Basophils Absolute: 0.1 10*3/uL (ref 0.0–0.1)
Basophils Relative: 0.8 % (ref 0.0–3.0)
Eosinophils Absolute: 0.1 10*3/uL (ref 0.0–0.7)
Eosinophils Relative: 2.1 % (ref 0.0–5.0)
HCT: 43.6 % (ref 39.0–52.0)
Hemoglobin: 14.7 g/dL (ref 13.0–17.0)
Lymphocytes Relative: 34.1 % (ref 12.0–46.0)
Lymphs Abs: 2.2 10*3/uL (ref 0.7–4.0)
MCHC: 33.7 g/dL (ref 30.0–36.0)
MCV: 87.1 fl (ref 78.0–100.0)
Monocytes Absolute: 0.5 10*3/uL (ref 0.1–1.0)
Monocytes Relative: 8 % (ref 3.0–12.0)
Neutro Abs: 3.5 10*3/uL (ref 1.4–7.7)
Neutrophils Relative %: 55 % (ref 43.0–77.0)
Platelets: 347 10*3/uL (ref 150.0–400.0)
RBC: 5.01 Mil/uL (ref 4.22–5.81)
RDW: 13.2 % (ref 11.5–15.5)
WBC: 6.3 10*3/uL (ref 4.0–10.5)

## 2019-06-27 LAB — BASIC METABOLIC PANEL
BUN: 10 mg/dL (ref 6–23)
CO2: 30 mEq/L (ref 19–32)
Calcium: 10.2 mg/dL (ref 8.4–10.5)
Chloride: 101 mEq/L (ref 96–112)
Creatinine, Ser: 1.17 mg/dL (ref 0.40–1.50)
GFR: 66.66 mL/min (ref >60.00)
Glucose, Bld: 93 mg/dL (ref 70–99)
Potassium: 4.7 mEq/L (ref 3.5–5.1)
Sodium: 139 mEq/L (ref 135–145)

## 2019-06-27 LAB — HEPATIC FUNCTION PANEL
ALT: 26 U/L (ref 0–53)
AST: 23 U/L (ref 0–37)
Albumin: 4.5 g/dL (ref 3.5–5.2)
Alkaline Phosphatase: 54 U/L (ref 39–117)
Bilirubin, Direct: 0.2 mg/dL (ref 0.0–0.3)
Total Bilirubin: 0.8 mg/dL (ref 0.2–1.2)
Total Protein: 7.2 g/dL (ref 6.0–8.3)

## 2019-06-27 LAB — LIPID PANEL
Cholesterol: 193 mg/dL (ref 0–200)
HDL: 57.7 mg/dL (ref >39.00)
LDL Cholesterol: 121 mg/dL — ABNORMAL HIGH (ref 0–99)
NonHDL: 135.12
Total CHOL/HDL Ratio: 3
Triglycerides: 72 mg/dL (ref 0.0–149.0)
VLDL: 14.4 mg/dL (ref 0.0–40.0)

## 2019-06-27 LAB — TSH: TSH: 1.41 u[IU]/mL (ref 0.35–4.50)

## 2019-06-28 LAB — PAIN MGMT, PROFILE 8 W/CONF, U
6 Acetylmorphine: NEGATIVE ng/mL
Alcohol Metabolites: NEGATIVE ng/mL (ref <500)
Amphetamines: NEGATIVE ng/mL
Benzodiazepines: NEGATIVE ng/mL
Buprenorphine, Urine: NEGATIVE ng/mL
Cocaine Metabolite: NEGATIVE ng/mL
Creatinine: 214.8 mg/dL
MDMA: NEGATIVE ng/mL
Marijuana Metabolite: NEGATIVE ng/mL
Opiates: NEGATIVE ng/mL
Oxidant: NEGATIVE ug/mL
Oxycodone: NEGATIVE ng/mL
pH: 5.1 (ref 4.5–9.0)

## 2019-07-06 ENCOUNTER — Encounter: Payer: Self-pay | Admitting: Family

## 2019-07-07 MED ORDER — ALPRAZOLAM 0.5 MG PO TABS: 0.5000 mg | ORAL_TABLET | Freq: Two times a day (BID) | ORAL | 0 refills | Status: DC | PRN

## 2019-08-08 ENCOUNTER — Other Ambulatory Visit: Payer: Self-pay | Admitting: Family

## 2019-08-08 MED ORDER — ALPRAZOLAM 0.5 MG PO TABS: 0.5000 mg | ORAL_TABLET | Freq: Two times a day (BID) | ORAL | 0 refills | Status: DC | PRN

## 2019-08-28 ENCOUNTER — Other Ambulatory Visit: Payer: Self-pay

## 2019-08-28 MED ORDER — AMLODIPINE BESYLATE 10 MG PO TABS: ORAL_TABLET | ORAL | 1 refills | Status: DC

## 2019-08-29 ENCOUNTER — Other Ambulatory Visit: Payer: Self-pay

## 2019-08-30 ENCOUNTER — Encounter: Payer: Self-pay | Admitting: Family

## 2019-08-30 ENCOUNTER — Ambulatory Visit: Payer: BLUE CROSS/BLUE SHIELD | Admitting: Family

## 2019-08-30 ENCOUNTER — Other Ambulatory Visit: Payer: Self-pay

## 2019-08-30 VITALS — BP 131/84 | HR 61 | Temp 98.6°F | Resp 16 | Ht 70.0 in | Wt 262.0 lb

## 2019-08-30 DIAGNOSIS — F321 Major depressive disorder, single episode, moderate: Secondary | ICD-10-CM | POA: Diagnosis not present

## 2019-08-30 DIAGNOSIS — F419 Anxiety disorder, unspecified: Secondary | ICD-10-CM | POA: Diagnosis not present

## 2019-08-30 DIAGNOSIS — I10 Essential (primary) hypertension: Secondary | ICD-10-CM | POA: Diagnosis not present

## 2019-08-30 MED ORDER — DULOXETINE HCL 30 MG PO CPEP: ORAL_CAPSULE | ORAL | 0 refills | Status: DC

## 2019-08-30 NOTE — Progress Notes (Signed)
Subjective:    Patient ID: Chad Mejia, male    DOB: [birth year], 49 y.o.   MRN: [id number]  HPI   Patient is a 49 yr old male who presents today for follow up.  HTN- maintained on lisinopril and amlodipine. Denies swelling or cough.  BP Readings from Last 3 Encounters:  08/30/19 131/84  02/24/19 108/70  08/02/18 118/82   Depression/anxiety- maintained on buspar, prn xanax.  Spouse feels like he is very depressed.  Denies SI/HI.  Feels like "everything is crap." He finds himself very irritable.  Doesn't sleep that well.   Review of Systems   See HPI  Past Medical History:  Diagnosis Date  . Anxiety   . Depression   . History of migraine   . Hyperlipidemia   . Hypertension      Social History   Socioeconomic History  . Marital status: Single    Spouse name: Not on file  . Number of children: Not on file  . Years of education: Not on file  . Highest education level: Not on file  Occupational History  . Not on file  Tobacco Use  . Smoking status: Passive Smoke Exposure - Never Smoker  . Smokeless tobacco: Never Used  Substance and Sexual Activity  . Alcohol use: Yes    Comment: rarely  . Drug use: No  . Sexual activity: Not on file  Other Topics Concern  . 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     Single, no children   Lives with friend   Has a cat   Enjoys painting,writing, reading   Buyer, retail degree   Social Determinants of Health   Financial Resource Strain:   . Difficulty of Paying Living Expenses:   Food Insecurity:   . Worried About Charity fundraiser in the Last Year:   . Arboriculturist in the Last Year:   Transportation Needs:   . Film/video editor (Medical):   Marland Kitchen Lack of Transportation (Non-Medical):   Physical Activity:   . Days of Exercise per Week:   . Minutes of Exercise per Session:   Stress:   . Feeling of Stress :   Social Connections:   . Frequency of Communication  with Friends and Family:   . Frequency of Social Gatherings with Friends and Family:   . Attends Religious Services:   . Active Member of Clubs or Organizations:   . Attends Archivist Meetings:   Marland Kitchen Marital Status:   Intimate Partner Violence:   . Fear of Current or Ex-Partner:   . Emotionally Abused:   Marland Kitchen Physically Abused:   . Sexually Abused:     Past Surgical History:  Procedure Laterality Date  . ELBOW SURGERY Left    "shattered elbow as a child"    Family History  Problem Relation Age of Onset  . Hyperlipidemia Mother   . Hypertension Mother   . Arthritis Maternal Grandmother   . Schizophrenia Maternal Aunt   . Drug abuse Sister        died from drug overdose, 1/2 sister    No Known Allergies  Current Outpatient Medications on File Prior to Visit  Medication Sig Dispense Refill  . ALPRAZolam (XANAX) 0.5 MG tablet Take 1 tablet (0.5 mg total) by mouth 2 (two) times daily as needed for anxiety. 30 tablet 0  . amLODipine (NORVASC) 10 MG tablet TAKE 1 TABLET(10 MG) BY MOUTH DAILY 90  tablet 1  . benzoyl peroxide-erythromycin (BENZAMYCIN) gel Apply topically 2 (two) times daily. 23.3 g 5  . busPIRone (BUSPAR) 15 MG tablet Take 1 tablet (15 mg total) by mouth 2 (two) times daily. 60 tablet 5  . lisinopril (ZESTRIL) 10 MG tablet Take 0.5 tablets (5 mg total) by mouth daily. 45 tablet 1   No current facility-administered medications on file prior to visit.    BP 131/84 (BP Location: Left Arm, Patient Position: Sitting, Cuff Size: Normal)   Pulse 61   Temp 98.6 F (37 C) (Temporal)   Resp 16   Ht 5\' 10"  (1.778 m)   Wt 262 lb (118.8 kg)   SpO2 97%   BMI 37.59 kg/m        Objective:   Physical Exam Constitutional:      General: He is not in acute distress.    Appearance: He is well-developed.  HENT:     Head: Normocephalic and atraumatic.  Cardiovascular:     Rate and Rhythm: Normal rate and regular rhythm.     Heart sounds: No murmur.  Pulmonary:       Effort: Pulmonary effort is normal. No respiratory distress.     Breath sounds: Normal breath sounds. No wheezing or rales.  Skin:    General: Skin is warm and dry.  Neurological:     Mental Status: He is alert and oriented to person, place, and time.  Psychiatric:        Attention and Perception: Attention normal.        Mood and Affect: Affect is flat.        Speech: Speech normal.        Behavior: Behavior normal.        Thought Content: Thought content normal.        Cognition and Memory: Cognition and memory normal.           Assessment & Plan:   HTN- bp stable on current meds, continue same. Labs up to date.  Depression/anxiety- anxiety is stable, depression uncontrolled. He has been on cymbalta in the past and tolerated.  Will restart cymbalta. Continue buspar for anxiety. Encouraged him to establish with a counselor as well.   This visit occurred during the SARS-CoV-2 public health emergency.  Safety protocols were in place, including screening questions prior to the visit, additional usage of staff PPE, and extensive cleaning of exam room while observing appropriate contact time as indicated for disinfecting solutions.

## 2019-08-30 NOTE — Patient Instructions (Addendum)
[place]- [phone] Start Cymbalta. Continue Buspar.    Below are two ways to schedule a Covid-19 Vaccine:  Please visit [email or web address] to register or call [phone]  Or call:  [facility] Covid-19 vaccine scheduling at [phone]

## 2019-09-10 ENCOUNTER — Other Ambulatory Visit: Payer: Self-pay | Admitting: Family

## 2019-09-11 MED ORDER — ALPRAZOLAM 0.5 MG PO TABS: 0.5000 mg | ORAL_TABLET | Freq: Two times a day (BID) | ORAL | 0 refills | Status: DC | PRN

## 2019-09-11 NOTE — Telephone Encounter (Signed)
Requesting: xanax Contract:09/08/19 UDS:07/06/19 Last Visit:06/08/19 Next Visit:09/29/19 Last Refill:2/07/19/19  Please Advise

## 2019-09-29 ENCOUNTER — Ambulatory Visit: Payer: BLUE CROSS/BLUE SHIELD | Admitting: Family

## 2019-10-16 ENCOUNTER — Encounter: Payer: Self-pay | Admitting: Family

## 2019-10-17 ENCOUNTER — Other Ambulatory Visit: Payer: Self-pay

## 2019-10-17 ENCOUNTER — Telehealth (INDEPENDENT_AMBULATORY_CARE_PROVIDER_SITE_OTHER): Payer: BLUE CROSS/BLUE SHIELD | Admitting: Family

## 2019-10-17 DIAGNOSIS — F329 Major depressive disorder, single episode, unspecified: Secondary | ICD-10-CM

## 2019-10-17 DIAGNOSIS — M778 Other enthesopathies, not elsewhere classified: Secondary | ICD-10-CM | POA: Diagnosis not present

## 2019-10-17 DIAGNOSIS — F32A Depression, unspecified: Secondary | ICD-10-CM

## 2019-10-17 NOTE — Progress Notes (Signed)
Virtual Visit via Video Note  I connected with Chad Mejia on 10/17/19 at  5:40 PM EDT by a video enabled telemedicine application and verified that I am speaking with the correct person using two identifiers.  Location: Patient: home Provider: work   I discussed the limitations of evaluation and management by telemedicine and the availability of in person appointments. The patient expressed understanding and agreed to proceed.  History of Present Illness:  Patient is a 49 year old male who presents today for follow-up.  Depression/anxiety- last visit he was noted to have uncontrolled depression symptoms.  We recommended he restart Cymbalta and continue BuSpar.  He states that he was very anxious about the Covid vaccine series and some of the side effects he read on the Cymbalta information packet.  As a result he did not start the Cymbalta.  He admits that his depression remains poorly controlled, and is now willing to start Cymbalta.  He reports that Thursday night he took a nap and his right forearm was "frozen."  Reports that he has been icing, stretching, wearing an ACE. Not hurting as much. Tried baby aspirin.  Some improvement.   Reports occasional right thumb joint "locks up." He is right handed.     Observations/Objective:   Gen: Awake, alert, no acute distress Resp: Breathing is even and non-labored Psych: calm/pleasant demeanor Neuro: Alert and Oriented x 3, + facial symmetry, speech is clear.   Assessment and Plan:  Forearm tendonitis- advised pt to purchase wrist immobilizer splint, trial of aleve 220mg  bid. If symptoms worsen or if not improved in 2 weeks, I have asked him to contact me and we will refer to sports medicine.  Depression- uncontrolled. Reviewed most common side effects of cymbalta with patient. He is willing to try cymbalta.  Follow up in 1 month.   Follow Up Instructions:    I discussed the assessment and treatment plan with the patient. The  patient was provided an opportunity to ask questions and all were answered. The patient agreed with the plan and demonstrated an understanding of the instructions.   The patient was advised to call back or seek an in-person evaluation if the symptoms worsen or if the condition fails to improve as anticipated.  Nance Pear, NP

## 2019-10-30 ENCOUNTER — Other Ambulatory Visit: Payer: Self-pay | Admitting: Family

## 2019-10-30 ENCOUNTER — Ambulatory Visit: Payer: BLUE CROSS/BLUE SHIELD | Admitting: Family

## 2019-10-30 NOTE — Telephone Encounter (Signed)
Requesting:  Alprazolam Contract:  Signed on 08/30/2019 UDS:  None Last Visit:   08/30/2019 Next Visit:   None scheduled Last Refill:  #30 no refills on 09/11/2019  Please Advise

## 2019-10-31 ENCOUNTER — Encounter: Payer: Self-pay | Admitting: Family

## 2019-10-31 MED ORDER — ALPRAZOLAM 0.5 MG PO TABS: 0.5000 mg | ORAL_TABLET | Freq: Two times a day (BID) | ORAL | 0 refills | Status: DC | PRN

## 2019-11-23 IMAGING — CR DG CHEST 2V
2 series · 2 of 2 positions shown · non-contrast
Comparison: None.

CLINICAL DATA: Chest pain and shortness of breath. History of
hypertension. Nonsmoker.

EXAM:
CHEST - 2 VIEW

[w chest pa]
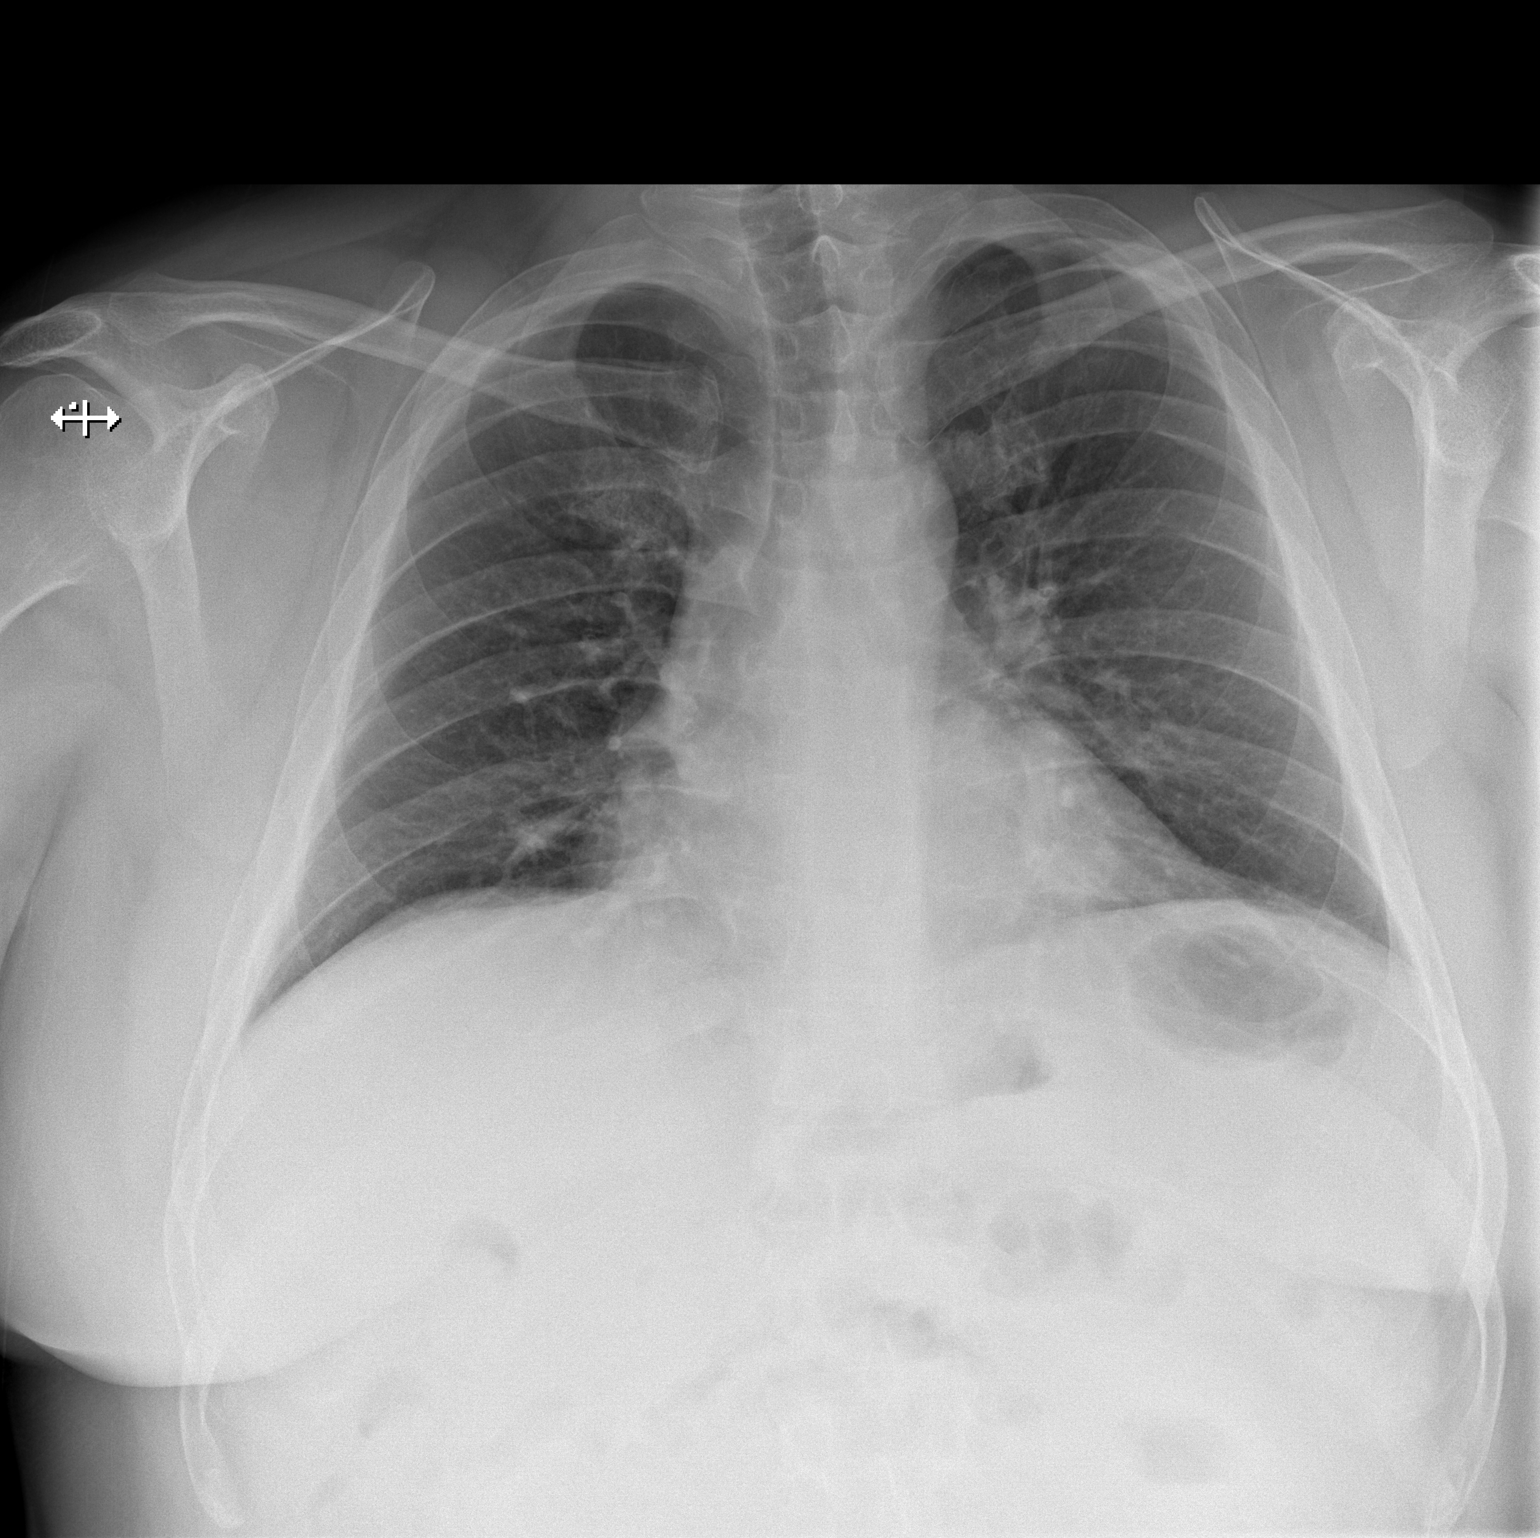

[w chest lat]
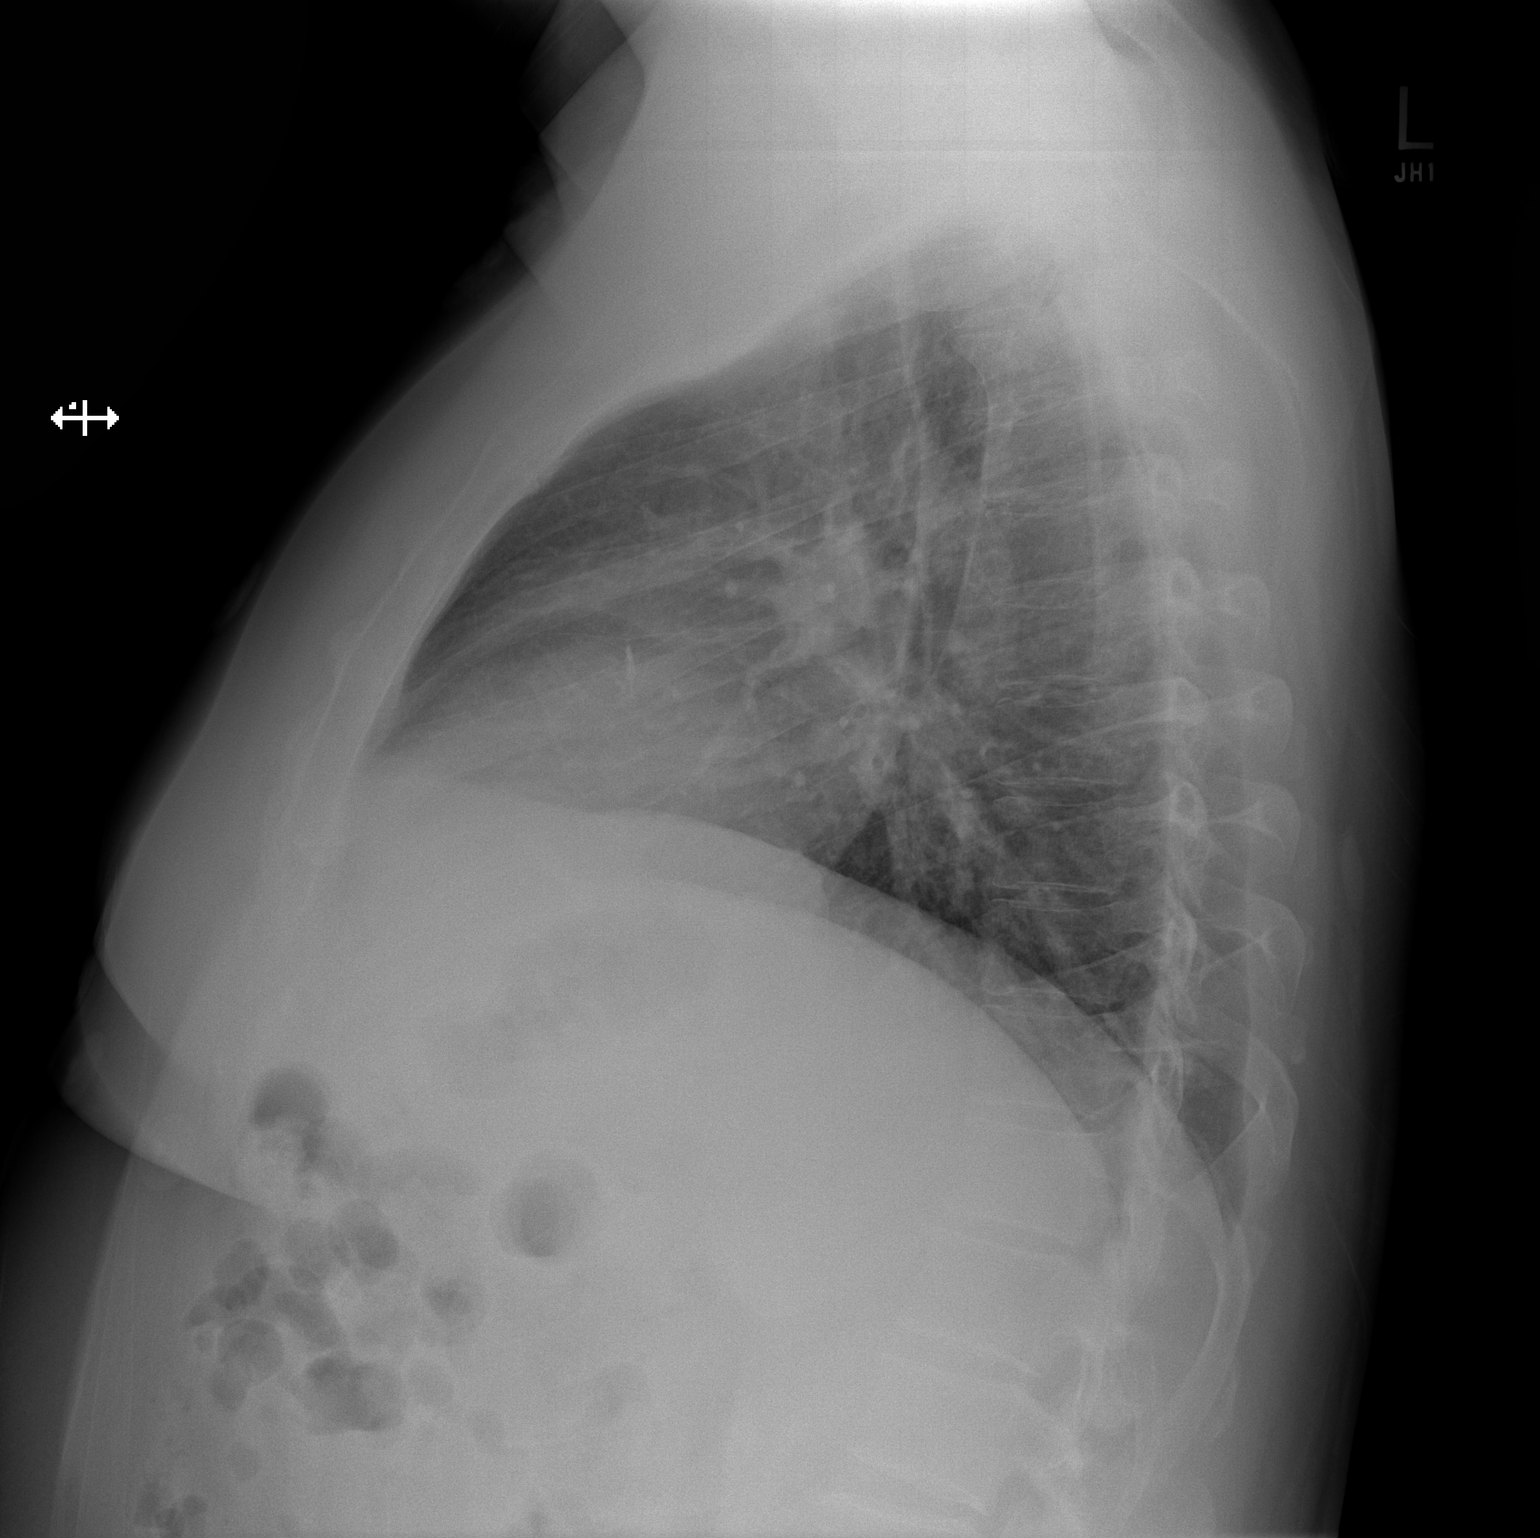

[2 of 2 positions shown; findings below may reference images not displayed]

FINDINGS: Shallow inspiration. Normal heart size and pulmonary vascularity. No
focal airspace disease or consolidation in the lungs. No blunting of
costophrenic angles. No pneumothorax. Mediastinal contours appear
intact.
IMPRESSION: No active cardiopulmonary disease.

## 2019-11-30 ENCOUNTER — Other Ambulatory Visit: Payer: Self-pay | Admitting: Family

## 2019-11-30 MED ORDER — ALPRAZOLAM 0.5 MG PO TABS: 0.5000 mg | ORAL_TABLET | Freq: Two times a day (BID) | ORAL | 0 refills | Status: DC | PRN

## 2019-12-20 ENCOUNTER — Telehealth: Payer: Self-pay | Admitting: *Deleted

## 2019-12-20 MED ORDER — BUSPIRONE HCL 15 MG PO TABS: 15.0000 mg | ORAL_TABLET | Freq: Two times a day (BID) | ORAL | 5 refills | Status: DC

## 2019-12-20 NOTE — Telephone Encounter (Signed)
Received request from [facility] for Buspirone 15mg  twice a day.  Refill sent. Sent mychart message to pt that he is past due for follow up and to schedule OV soon.

## 2019-12-21 ENCOUNTER — Other Ambulatory Visit: Payer: Self-pay

## 2019-12-21 MED ORDER — LISINOPRIL 10 MG PO TABS: 5.0000 mg | ORAL_TABLET | Freq: Every day | ORAL | 1 refills | Status: DC

## 2020-01-03 ENCOUNTER — Other Ambulatory Visit: Payer: Self-pay | Admitting: Family

## 2020-01-03 NOTE — Telephone Encounter (Addendum)
Received notice that 12/20/19 mychart message has not been read. Called pt and left message on voicemail to check mychart message and respond back.

## 2020-01-04 MED ORDER — ALPRAZOLAM 0.5 MG PO TABS: 0.5000 mg | ORAL_TABLET | Freq: Two times a day (BID) | ORAL | 0 refills | Status: DC | PRN

## 2020-01-04 NOTE — Telephone Encounter (Signed)
Melissa -- please see pt's mychart message and let me know when he is due for a routine follow up.

## 2020-01-04 NOTE — Addendum Note (Signed)
Addended by: Kelle Darting A on: 01/04/2020 11:44 AM   Modules accepted: Orders

## 2020-01-04 NOTE — Telephone Encounter (Signed)
See mychart.  

## 2020-02-04 ENCOUNTER — Other Ambulatory Visit: Payer: Self-pay | Admitting: Family

## 2020-02-05 MED ORDER — ALPRAZOLAM 0.5 MG PO TABS: 0.5000 mg | ORAL_TABLET | Freq: Two times a day (BID) | ORAL | 0 refills | Status: DC | PRN

## 2020-02-14 ENCOUNTER — Ambulatory Visit (INDEPENDENT_AMBULATORY_CARE_PROVIDER_SITE_OTHER): Payer: BLUE CROSS/BLUE SHIELD | Admitting: Family

## 2020-02-14 ENCOUNTER — Other Ambulatory Visit: Payer: Self-pay

## 2020-02-14 VITALS — BP 122/70 | HR 56 | Resp 16 | Ht 70.0 in | Wt 273.0 lb

## 2020-02-14 DIAGNOSIS — R635 Abnormal weight gain: Secondary | ICD-10-CM | POA: Diagnosis not present

## 2020-02-14 DIAGNOSIS — I10 Essential (primary) hypertension: Secondary | ICD-10-CM | POA: Diagnosis not present

## 2020-02-14 DIAGNOSIS — F418 Other specified anxiety disorders: Secondary | ICD-10-CM

## 2020-02-14 DIAGNOSIS — Z23 Encounter for immunization: Secondary | ICD-10-CM | POA: Diagnosis not present

## 2020-02-14 NOTE — Patient Instructions (Signed)
[facility]- [phone]

## 2020-02-14 NOTE — Progress Notes (Signed)
Subjective:    Patient ID: Chad Mejia, male    DOB: 1971-03-18, 49 y.o.   MRN: [id number]  HPI  Depression/anxiety- overall anxiety has been well controlled on buspar. Uses xanax sparingly.   He did not start cymbalta because he was worried about possible side effects. Notes that his girlfriend has had a "rough couple of months" related to her mental health. He has been worried about her and stress eating more which has contributed to his weight gain.   Wt Readings from Last 3 Encounters:  02/14/20 273 lb (123.8 kg)  08/30/19 262 lb (118.8 kg)  02/24/19 259 lb (117.5 kg)   HTN- maintained on lisinopril and amlodipine.  Reports good compliance and denies LE edema.  BP Readings from Last 3 Encounters:  02/14/20 122/70  08/30/19 131/84  02/24/19 108/70      Review of Systems See HPI  Past Medical History:  Diagnosis Date  . Anxiety   . Depression   . History of migraine   . Hyperlipidemia   . Hypertension      Social History   Socioeconomic History  . Marital status: Single    Spouse name: Not on file  . Number of children: Not on file  . Years of education: Not on file  . Highest education level: Not on file  Occupational History  . Not on file  Tobacco Use  . Smoking status: Passive Smoke Exposure - Never Smoker  . Smokeless tobacco: Never Used  Vaping Use  . Vaping Use: Never used  Substance and Sexual Activity  . Alcohol use: Yes    Comment: rarely  . Drug use: No  . Sexual activity: Not on file  Other Topics Concern  . 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     Single, no children   Lives with friend   Has a cat   Enjoys painting,writing, reading   Bachelors degree   Social Determinants of Health   Financial Resource Strain:   . Difficulty of Paying Living Expenses: Not on file  Food Insecurity:   . Worried About Charity fundraiser in the Last Year: Not on file  . Ran Out of Food in  the Last Year: Not on file  Transportation Needs:   . Lack of Transportation (Medical): Not on file  . Lack of Transportation (Non-Medical): Not on file  Physical Activity:   . Days of Exercise per Week: Not on file  . Minutes of Exercise per Session: Not on file  Stress:   . Feeling of Stress : Not on file  Social Connections:   . Frequency of Communication with Friends and Family: Not on file  . Frequency of Social Gatherings with Friends and Family: Not on file  . Attends Religious Services: Not on file  . Active Member of Clubs or Organizations: Not on file  . Attends Archivist Meetings: Not on file  . Marital Status: Not on file  Intimate Partner Violence:   . Fear of Current or Ex-Partner: Not on file  . Emotionally Abused: Not on file  . Physically Abused: Not on file  . Sexually Abused: Not on file    Past Surgical History:  Procedure Laterality Date  . ELBOW SURGERY Left    "shattered elbow as a child"    Family History  Problem Relation Age of Onset  . Hyperlipidemia Mother   . Hypertension Mother   . Arthritis  Maternal Grandmother   . Schizophrenia Maternal Aunt   . Drug abuse Sister        died from drug overdose, 1/2 sister    No Known Allergies  Current Outpatient Medications on File Prior to Visit  Medication Sig Dispense Refill  . ALPRAZolam (XANAX) 0.5 MG tablet Take 1 tablet (0.5 mg total) by mouth 2 (two) times daily as needed for anxiety. 30 tablet 0  . amLODipine (NORVASC) 10 MG tablet TAKE 1 TABLET(10 MG) BY MOUTH DAILY 90 tablet 1  . benzoyl peroxide-erythromycin (BENZAMYCIN) gel Apply topically 2 (two) times daily. 23.3 g 5  . busPIRone (BUSPAR) 15 MG tablet Take 1 tablet (15 mg total) by mouth 2 (two) times daily. 60 tablet 5  . lisinopril (ZESTRIL) 10 MG tablet Take 0.5 tablets (5 mg total) by mouth daily. 45 tablet 1   No current facility-administered medications on file prior to visit.    BP 122/70 (BP Location: Right Arm,  Patient Position: Sitting, Cuff Size: Large)   Pulse (!) 56   Resp 16   Ht 5\' 10"  (1.778 m)   Wt 273 lb (123.8 kg)   SpO2 97%   BMI 39.17 kg/m       Objective:   Physical Exam Constitutional:      General: He is not in acute distress.    Appearance: He is well-developed.  HENT:     Head: Normocephalic and atraumatic.  Cardiovascular:     Rate and Rhythm: Normal rate and regular rhythm.     Heart sounds: No murmur heard.   Pulmonary:     Effort: Pulmonary effort is normal. No respiratory distress.     Breath sounds: Normal breath sounds. No wheezing or rales.  Skin:    General: Skin is warm and dry.  Neurological:     Mental Status: He is alert and oriented to person, place, and time.  Psychiatric:        Behavior: Behavior normal.        Thought Content: Thought content normal.           Assessment & Plan:  HTN- bp stable on current regimen. Continue same. Obtain follow up BMET.  Depression/anxiety- anxiety is stable. Depression is not optimally controlled at this time. He does not wish to add any additional medications at this time. He would consider counseling but does not think it is covered by his insurance. I advised him to call his insurance and if covered he could call [facility].  If not covered, he could consider an online counseling resource such as "[facility]." This service is typically less expensive.    Weight gain- discussed diet/exercise/weight loss.  Flu shot today.  This visit occurred during the SARS-CoV-2 public health emergency.  Safety protocols were in place, including screening questions prior to the visit, additional usage of staff PPE, and extensive cleaning of exam room while observing appropriate contact time as indicated for disinfecting solutions.

## 2020-02-15 LAB — BASIC METABOLIC PANEL
BUN: 9 mg/dL (ref 7–25)
CO2: 27 mmol/L (ref 20–32)
Calcium: 9.9 mg/dL (ref 8.6–10.3)
Chloride: 104 mmol/L (ref 98–110)
Creat: 1.22 mg/dL (ref 0.60–1.35)
Glucose, Bld: 100 mg/dL — ABNORMAL HIGH (ref 65–99)
Potassium: 4.2 mmol/L (ref 3.5–5.3)
Sodium: 137 mmol/L (ref 135–146)

## 2020-02-19 ENCOUNTER — Other Ambulatory Visit: Payer: Self-pay | Admitting: Family

## 2020-03-10 ENCOUNTER — Encounter: Payer: Self-pay | Admitting: Family

## 2020-03-10 ENCOUNTER — Other Ambulatory Visit: Payer: Self-pay | Admitting: Family

## 2020-03-11 MED ORDER — LISINOPRIL 10 MG PO TABS
5.0000 mg | ORAL_TABLET | Freq: Every day | ORAL | 1 refills | Status: DC
Start: 2020-03-11 — End: 2020-06-18

## 2020-03-11 MED ORDER — ALPRAZOLAM 0.5 MG PO TABS: 0.5000 mg | ORAL_TABLET | Freq: Two times a day (BID) | ORAL | 0 refills | Status: DC | PRN

## 2020-04-11 ENCOUNTER — Other Ambulatory Visit: Payer: Self-pay | Admitting: Family

## 2020-04-11 NOTE — Telephone Encounter (Signed)
Requesting:ALPRAZolam (XANAX) 0.5 MG tablet Contract:08/30/19 UDS:08/30/19 Last Visit:06/18/20 Next Visit:02/14/20 Last Refill:03/11/20  Please Advise

## 2020-04-12 MED ORDER — ALPRAZOLAM 0.5 MG PO TABS: 0.5000 mg | ORAL_TABLET | Freq: Two times a day (BID) | ORAL | 0 refills | Status: DC | PRN

## 2020-04-16 ENCOUNTER — Encounter: Payer: Self-pay | Admitting: Family

## 2020-05-13 ENCOUNTER — Other Ambulatory Visit: Payer: Self-pay | Admitting: Family

## 2020-05-14 MED ORDER — ALPRAZOLAM 0.5 MG PO TABS: 0.5000 mg | ORAL_TABLET | Freq: Two times a day (BID) | ORAL | 0 refills | Status: DC | PRN

## 2020-05-14 NOTE — Telephone Encounter (Signed)
Last written: 04/12/20 Last ov: 02/14/20 Next ov: 06/18/20 Contract: 08/30/19 UDS: 08/30/19

## 2020-06-12 ENCOUNTER — Other Ambulatory Visit: Payer: Self-pay | Admitting: Family

## 2020-06-12 MED ORDER — ALPRAZOLAM 0.5 MG PO TABS: 0.5000 mg | ORAL_TABLET | Freq: Two times a day (BID) | ORAL | 0 refills | Status: DC | PRN

## 2020-06-12 NOTE — Telephone Encounter (Signed)
Requesting: alprazolam Contract:  08/30/19 UDS: 08/30/19 Last Visit: 02/14/20 Next Visit:  06/18/20 Last Refill: 05/14/20  Please Advise

## 2020-06-13 ENCOUNTER — Encounter: Payer: Self-pay | Admitting: Family

## 2020-06-17 ENCOUNTER — Other Ambulatory Visit: Payer: Self-pay

## 2020-06-18 ENCOUNTER — Ambulatory Visit: Payer: BLUE CROSS/BLUE SHIELD | Admitting: Family

## 2020-06-18 ENCOUNTER — Other Ambulatory Visit: Payer: Self-pay

## 2020-06-18 DIAGNOSIS — Z0000 Encounter for general adult medical examination without abnormal findings: Secondary | ICD-10-CM

## 2020-06-18 DIAGNOSIS — I10 Essential (primary) hypertension: Secondary | ICD-10-CM | POA: Diagnosis not present

## 2020-06-18 DIAGNOSIS — Z1159 Encounter for screening for other viral diseases: Secondary | ICD-10-CM | POA: Diagnosis not present

## 2020-06-18 DIAGNOSIS — F419 Anxiety disorder, unspecified: Secondary | ICD-10-CM | POA: Diagnosis not present

## 2020-06-18 LAB — BASIC METABOLIC PANEL
BUN: 8 mg/dL (ref 6–23)
CO2: 29 mEq/L (ref 19–32)
Calcium: 9.8 mg/dL (ref 8.4–10.5)
Chloride: 103 mEq/L (ref 96–112)
Creatinine, Ser: 1.1 mg/dL (ref 0.40–1.50)
GFR: 79.37 mL/min (ref >60.00)
Glucose, Bld: 94 mg/dL (ref 70–99)
Potassium: 3.9 mEq/L (ref 3.5–5.1)
Sodium: 138 mEq/L (ref 135–145)

## 2020-06-18 MED ORDER — AMLODIPINE BESYLATE 10 MG PO TABS: 10.0000 mg | ORAL_TABLET | Freq: Every day | ORAL | 1 refills | Status: DC

## 2020-06-18 MED ORDER — BUSPIRONE HCL 15 MG PO TABS: 15.0000 mg | ORAL_TABLET | Freq: Two times a day (BID) | ORAL | 1 refills | Status: DC

## 2020-06-18 MED ORDER — LISINOPRIL 10 MG PO TABS: 5.0000 mg | ORAL_TABLET | Freq: Every day | ORAL | 1 refills | Status: DC

## 2020-06-18 NOTE — Progress Notes (Signed)
Subjective:    Patient ID: Chad Mejia, male    DOB: 11-Jul-1970, 50 y.o.   MRN: [id number]  HPI  Patient is a 50 yr old male who presents today for follow up.  HTN- maintained on amlodipine 10mg , lisinopril 10mg .    BP Readings from Last 3 Encounters:  06/18/20 123/76  02/14/20 122/70  08/30/19 131/84   Acne- not currently using benzamycin.  No recent flare up of acne.  Anxiety/depression- maintained on buspar 15mg  bid and xanax 0.5mg  bid prn.  Girlfriend's mom had a fall, developed pneumonia.  Has dementia.  Girlfriend has issues with mental health and was almost hospitalized.  This caused him more stress.  Uses xanax a few times a week on a typical week. Denies suicidal ideation but does report periods of hopelessness.  Wt Readings from Last 3 Encounters:  06/18/20 263 lb (119.3 kg)  02/14/20 273 lb (123.8 kg)  08/30/19 262 lb (118.8 kg)    Review of Systems See HPI  Past Medical History:  Diagnosis Date  . Anxiety   . Depression   . History of migraine   . Hyperlipidemia   . Hypertension      Social History   Socioeconomic History  . Marital status: Single    Spouse name: Not on file  . Number of children: Not on file  . Years of education: Not on file  . Highest education level: Not on file  Occupational History  . Not on file  Tobacco Use  . Smoking status: Passive Smoke Exposure - Never Smoker  . Smokeless tobacco: Never Used  Vaping Use  . Vaping Use: Never used  Substance and Sexual Activity  . Alcohol use: Yes    Comment: rarely  . Drug use: No  . Sexual activity: Not on file  Other Topics Concern  . 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     Single, no children   Lives with friend   Has a cat   Enjoys painting,writing, reading   Buyer, retail degree   Social Determinants of Health   Financial Resource Strain: Not on file  Food Insecurity: Not on file  Transportation Needs: Not on  file  Physical Activity: Not on file  Stress: Not on file  Social Connections: Not on file  Intimate Partner Violence: Not on file    Past Surgical History:  Procedure Laterality Date  . ELBOW SURGERY Left    "shattered elbow as a child"    Family History  Problem Relation Age of Onset  . Hyperlipidemia Mother   . Hypertension Mother   . Arthritis Maternal Grandmother   . Schizophrenia Maternal Aunt   . Drug abuse Sister        died from drug overdose, 1/2 sister    No Known Allergies  Current Outpatient Medications on File Prior to Visit  Medication Sig Dispense Refill  . ALPRAZolam (XANAX) 0.5 MG tablet Take 1 tablet (0.5 mg total) by mouth 2 (two) times daily as needed for anxiety. 30 tablet 0  . amLODipine (NORVASC) 10 MG tablet TAKE 1 TABLET(10 MG) BY MOUTH DAILY 90 tablet 1  . benzoyl peroxide-erythromycin (BENZAMYCIN) gel Apply topically 2 (two) times daily. 23.3 g 5  . busPIRone (BUSPAR) 15 MG tablet Take 1 tablet (15 mg total) by mouth 2 (two) times daily. 60 tablet 5  . lisinopril (ZESTRIL) 10 MG tablet Take 0.5 tablets (5 mg total) by mouth daily. [place]  tablet 1   No current facility-administered medications on file prior to visit.    BP 123/76 (BP Location: Right Arm, Patient Position: Sitting, Cuff Size: Large)   Pulse 68   Temp 98.6 F (37 C) (Oral)   Resp 16   Ht 5\' 10"  (1.778 m)   Wt 263 lb (119.3 kg)   SpO2 99%   BMI 37.74 kg/m       Objective:   Physical Exam Constitutional:      General: He is not in acute distress.    Appearance: He is well-developed and well-nourished.  HENT:     Head: Normocephalic and atraumatic.  Cardiovascular:     Rate and Rhythm: Normal rate and regular rhythm.     Heart sounds: No murmur heard.   Pulmonary:     Effort: Pulmonary effort is normal. No respiratory distress.     Breath sounds: Normal breath sounds. No wheezing or rales.  Musculoskeletal:        General: No edema.  Skin:    General: Skin is warm  and dry.  Neurological:     Mental Status: He is alert and oriented to person, place, and time.  Psychiatric:        Mood and Affect: Mood and affect normal.        Behavior: Behavior normal.        Thought Content: Thought content normal.           Assessment & Plan:  Anxiety/depression- fair control. Recommended that he establish with a therapist. Continue buspar/xanax.  Controlled substance contract is updated and will obtain UDS.    HTN- bp at goal. Continue amlodipine 10mg  and lisinopril 10mg .  Obtain follow up bmet.  Commended pt on his diet/exercise/weight loss efforts.  Acne- stable.  Has benzamycin for prn use.   This visit occurred during the SARS-CoV-2 public health emergency.  Safety protocols were in place, including screening questions prior to the visit, additional usage of staff PPE, and extensive cleaning of exam room while observing appropriate contact time as indicated for disinfecting solutions.

## 2020-06-18 NOTE — Patient Instructions (Signed)
Please schedule an appointment with a counselor. Complete lab work prior to leaving. You should be contacted about scheduling your colonoscopy.

## 2020-06-19 LAB — HEPATITIS C ANTIBODY
Hepatitis C Ab: NONREACTIVE
SIGNAL TO CUT-OFF: 0.01 (ref <1.00)

## 2020-06-19 LAB — DRUG MONITORING, PANEL 8 WITH CONFIRMATION, URINE
6 Acetylmorphine: NEGATIVE ng/mL (ref <10)
Alcohol Metabolites: NEGATIVE ng/mL
Amphetamines: NEGATIVE ng/mL (ref <500)
Benzodiazepines: NEGATIVE ng/mL (ref <100)
Buprenorphine, Urine: NEGATIVE ng/mL (ref <5)
Cocaine Metabolite: NEGATIVE ng/mL (ref <150)
Creatinine: >300 mg/dL
MDMA: NEGATIVE ng/mL (ref <500)
Marijuana Metabolite: NEGATIVE ng/mL (ref <20)
Opiates: NEGATIVE ng/mL (ref <100)
Oxidant: NEGATIVE ug/mL
Oxycodone: NEGATIVE ng/mL (ref <100)
pH: 5.2 (ref 4.5–9.0)

## 2020-06-19 LAB — DM TEMPLATE

## 2020-07-14 ENCOUNTER — Other Ambulatory Visit: Payer: Self-pay | Admitting: Family

## 2020-07-15 NOTE — Telephone Encounter (Signed)
Requesting: alprazolam 0.5mg  Contract: 05/29/2019 UDS: 06/18/2020 Last Visit: 06/18/2020 Next Visit: 09/17/2020 Last Refill: 06/12/2020 #30 and 0RF  Please Advise

## 2020-07-16 MED ORDER — ALPRAZOLAM 0.5 MG PO TABS: 0.5000 mg | ORAL_TABLET | Freq: Two times a day (BID) | ORAL | 0 refills | Status: DC | PRN

## 2020-07-24 DIAGNOSIS — Z20822 Contact with and (suspected) exposure to covid-19: Secondary | ICD-10-CM | POA: Diagnosis not present

## 2020-08-15 ENCOUNTER — Other Ambulatory Visit: Payer: Self-pay | Admitting: Family

## 2020-08-15 MED ORDER — ALPRAZOLAM 0.5 MG PO TABS: 0.5000 mg | ORAL_TABLET | Freq: Two times a day (BID) | ORAL | 0 refills | Status: DC | PRN

## 2020-08-15 NOTE — Telephone Encounter (Signed)
Requesting: alprazolam 0.5mg  Contract:  UDS: 06/18/2020 Last Visit: 06/18/2020 Next Visit: 09/17/20  Last Refill: 07/16/2020 #30 AND 0rf pT SIG: 1 TAB BID PRN  Please Advise

## 2020-08-22 ENCOUNTER — Other Ambulatory Visit: Payer: Self-pay

## 2020-08-22 ENCOUNTER — Encounter: Payer: Self-pay | Admitting: Internal Medicine

## 2020-08-22 ENCOUNTER — Ambulatory Visit (AMBULATORY_SURGERY_CENTER): Payer: BLUE CROSS/BLUE SHIELD

## 2020-08-22 VITALS — Ht 70.0 in | Wt 271.0 lb

## 2020-08-22 DIAGNOSIS — Z1211 Encounter for screening for malignant neoplasm of colon: Secondary | ICD-10-CM

## 2020-08-22 MED ORDER — PLENVU 140 G PO SOLR
1.0000 | ORAL | 0 refills | Status: DC
Start: 2020-08-22 — End: 2020-09-05

## 2020-08-22 NOTE — Progress Notes (Signed)
No allergies to soy or egg Pt is not on blood thinners or diet pills Denies issues with sedation/intubation Denies atrial flutter/fib Denies constipation   Emmi instructions given to pt  Pt is aware of Covid safety and care partner requirements.  

## 2020-09-04 ENCOUNTER — Other Ambulatory Visit: Payer: Self-pay | Admitting: Family

## 2020-09-05 ENCOUNTER — Other Ambulatory Visit: Payer: Self-pay | Admitting: Internal Medicine

## 2020-09-05 ENCOUNTER — Ambulatory Visit (AMBULATORY_SURGERY_CENTER): Payer: BLUE CROSS/BLUE SHIELD | Admitting: Internal Medicine

## 2020-09-05 ENCOUNTER — Encounter: Payer: Self-pay | Admitting: Internal Medicine

## 2020-09-05 ENCOUNTER — Other Ambulatory Visit: Payer: Self-pay

## 2020-09-05 VITALS — BP 115/70 | HR 56 | Temp 97.3°F | Resp 15 | Ht 70.0 in | Wt 271.0 lb

## 2020-09-05 DIAGNOSIS — D123 Benign neoplasm of transverse colon: Secondary | ICD-10-CM

## 2020-09-05 DIAGNOSIS — Z1211 Encounter for screening for malignant neoplasm of colon: Secondary | ICD-10-CM | POA: Diagnosis not present

## 2020-09-05 DIAGNOSIS — D122 Benign neoplasm of ascending colon: Secondary | ICD-10-CM

## 2020-09-05 DIAGNOSIS — D120 Benign neoplasm of cecum: Secondary | ICD-10-CM

## 2020-09-05 MED ORDER — SODIUM CHLORIDE 0.9 % IV SOLN: 500.0000 mL | Freq: Once | INTRAVENOUS | Status: DC

## 2020-09-05 NOTE — Progress Notes (Signed)
[facility]  Pt's states no medical or surgical changes since previsit or office visit.

## 2020-09-05 NOTE — Progress Notes (Signed)
pt tolerated well. VSS. awake and to recovery. Report given to RN.  

## 2020-09-05 NOTE — Patient Instructions (Signed)
YOU HAD AN ENDOSCOPIC PROCEDURE TODAY AT THE [facility]:   Refer to the procedure report that was given to you for any specific questions about what was found during the examination.  If the procedure report does not answer your questions, please call your gastroenterologist to clarify.  If you requested that your care partner not be given the details of your procedure findings, then the procedure report has been included in a sealed envelope for you to review at your convenience later.  YOU SHOULD EXPECT: Some feelings of bloating in the abdomen. Passage of more gas than usual.  Walking can help get rid of the air that was put into your GI tract during the procedure and reduce the bloating. If you had a lower endoscopy (such as a colonoscopy or flexible sigmoidoscopy) you may notice spotting of blood in your stool or on the toilet paper. If you underwent a bowel prep for your procedure, you may not have a normal bowel movement for a few days.  Please Note:  You might notice some irritation and congestion in your nose or some drainage.  This is from the oxygen used during your procedure.  There is no need for concern and it should clear up in a day or so.  SYMPTOMS TO REPORT IMMEDIATELY:   Following lower endoscopy (colonoscopy or flexible sigmoidoscopy):  Excessive amounts of blood in the stool  Significant tenderness or worsening of abdominal pains  Swelling of the abdomen that is new, acute  Fever of 100F or higher   Following upper endoscopy (EGD)  Vomiting of blood or coffee ground material  New chest pain or pain under the shoulder blades  Painful or persistently difficult swallowing  New shortness of breath  Fever of 100F or higher  Black, tarry-looking stools  For urgent or emergent issues, a gastroenterologist can be reached at any hour by calling [phone]. Do not use MyChart messaging for urgent concerns.    DIET:  We do recommend a small meal at first, but  then you may proceed to your regular diet.  Drink plenty of fluids but you should avoid alcoholic beverages for 24 hours.  ACTIVITY:  You should plan to take it easy for the rest of today and you should NOT DRIVE or use heavy machinery until tomorrow (because of the sedation medicines used during the test).    FOLLOW UP: Our staff will call the number listed on your records 48-72 hours following your procedure to check on you and address any questions or concerns that you may have regarding the information given to you following your procedure. If we do not reach you, we will leave a message.  We will attempt to reach you two times.  During this call, we will ask if you have developed any symptoms of COVID 19. If you develop any symptoms (ie: fever, flu-like symptoms, shortness of breath, cough etc.) before then, please call [phone].  If you test positive for Covid 19 in the 2 weeks post procedure, please call and report this information to us.    If any biopsies were taken you will be contacted by phone or by letter within the next 1-3 weeks.  Please call us at [phone] if you have not heard about the biopsies in 3 weeks.    SIGNATURES/CONFIDENTIALITY: You and/or your care partner have signed paperwork which will be entered into your electronic medical record.  These signatures attest to the fact that that the information above on   your After Visit Summary has been reviewed and is understood.  Full responsibility of the confidentiality of this discharge information lies with you and/or your care-partner. 

## 2020-09-05 NOTE — Op Note (Signed)
[facility] Patient Name: Chad Mejia Procedure Date: 09/05/2020 8:58 AM MRN: [id number] Endoscopist: Jerene Bears , MD Age: 50 Referring MD:  Date of Birth: February 04, 1971 Gender: Male Account #: [id number] Procedure:                Colonoscopy Indications:              Screening for colorectal malignant neoplasm, This                            is the patient's first colonoscopy Medicines:                Monitored Anesthesia Care Procedure:                Pre-Anesthesia Assessment:                           - Prior to the procedure, a History and Physical                            was performed, and patient medications and                            allergies were reviewed. The patient's tolerance of                            previous anesthesia was also reviewed. The risks                            and benefits of the procedure and the sedation                            options and risks were discussed with the patient.                            All questions were answered, and informed consent                            was obtained. Prior Anticoagulants: The patient has                            taken no previous anticoagulant or antiplatelet                            agents. ASA Grade Assessment: II - A patient with                            mild systemic disease. After reviewing the risks                            and benefits, the patient was deemed in                            satisfactory condition to undergo the procedure.  After obtaining informed consent, the colonoscope                            was passed under direct vision. Throughout the                            procedure, the patient's blood pressure, pulse, and                            oxygen saturations were monitored continuously. The                            Olympus CF-HQ190 (#[id number]) Colonoscope was                            introduced through the anus and  advanced to the                            terminal ileum. The colonoscopy was performed                            without difficulty. The patient tolerated the                            procedure well. The quality of the bowel                            preparation was excellent. The terminal ileum,                            ileocecal valve, appendiceal orifice, and rectum                            were photographed. Scope In: 9:02:12 AM Scope Out: 9:18:38 AM Scope Withdrawal Time: 0 hours 13 minutes 37 seconds  Total Procedure Duration: 0 hours 16 minutes 26 seconds  Findings:                 The digital rectal exam was normal.                           The terminal ileum appeared normal.                           A 5 mm polyp was found in the cecum. The polyp was                            sessile. The polyp was removed with a cold snare.                            Resection and retrieval were complete.                           A 3 mm polyp was found in the ascending colon. The  polyp was sessile. The polyp was removed with a                            cold snare. Resection and retrieval were complete.                           Two sessile polyps were found in the transverse                            colon. The polyps were 3 to 5 mm in size. These                            polyps were removed with a cold snare. Resection                            and retrieval were complete.                           Multiple small-mouthed diverticula were found in                            the sigmoid colon.                           Internal hemorrhoids were found during                            retroflexion. The hemorrhoids were small. Complications:            No immediate complications. Estimated Blood Loss:     Estimated blood loss was minimal. Impression:               - The examined portion of the ileum was normal.                           - One 5 mm  polyp in the cecum, removed with a cold                            snare. Resected and retrieved.                           - One 3 mm polyp in the ascending colon, removed                            with a cold snare. Resected and retrieved.                           - Two 3 to 5 mm polyps in the transverse colon,                            removed with a cold snare. Resected and retrieved.                           - Diverticulosis in the sigmoid  colon.                           - Small internal hemorrhoids. Recommendation:           - Patient has a contact number available for                            emergencies. The signs and symptoms of potential                            delayed complications were discussed with the                            patient. Return to normal activities tomorrow.                            Written discharge instructions were provided to the                            patient.                           - Resume previous diet.                           - Continue present medications.                           - Await pathology results.                           - Repeat colonoscopy is recommended. The                            colonoscopy date will be determined after pathology                            results from today's exam become available for                            review. Jerene Bears, MD 09/05/2020 9:21:43 AM This report has been signed electronically.

## 2020-09-05 NOTE — Progress Notes (Signed)
Called to room to assist during endoscopic procedure.  Patient ID and intended procedure confirmed with present staff. Received instructions for my participation in the procedure from the performing physician.  

## 2020-09-09 ENCOUNTER — Telehealth: Payer: Self-pay | Admitting: *Deleted

## 2020-09-09 ENCOUNTER — Telehealth: Payer: Self-pay

## 2020-09-09 NOTE — Telephone Encounter (Signed)
Good afternoon,  Patient is returning missed call from the nurse.. Patient states the discharge summary was informative. Patient states overall he is fine..he mentioned having light spotting but that has stopped..  Thank you

## 2020-09-09 NOTE — Telephone Encounter (Signed)
No answer, left message to call back later today, B.Trenese Haft RN. 

## 2020-09-09 NOTE — Telephone Encounter (Signed)
No answer for post procedure call. Left message for patient to call with questions or concerns.

## 2020-09-17 ENCOUNTER — Other Ambulatory Visit: Payer: Self-pay

## 2020-09-17 ENCOUNTER — Encounter: Payer: Self-pay | Admitting: Family

## 2020-09-17 ENCOUNTER — Ambulatory Visit (INDEPENDENT_AMBULATORY_CARE_PROVIDER_SITE_OTHER): Payer: BLUE CROSS/BLUE SHIELD | Admitting: Family

## 2020-09-17 ENCOUNTER — Encounter: Payer: Self-pay | Admitting: Internal Medicine

## 2020-09-17 VITALS — BP 130/85 | HR 71 | Temp 97.6°F | Resp 17 | Ht 70.0 in | Wt 270.0 lb

## 2020-09-17 DIAGNOSIS — Z0000 Encounter for general adult medical examination without abnormal findings: Secondary | ICD-10-CM | POA: Diagnosis not present

## 2020-09-17 DIAGNOSIS — E785 Hyperlipidemia, unspecified: Secondary | ICD-10-CM

## 2020-09-17 LAB — COMPREHENSIVE METABOLIC PANEL
ALT: 24 U/L (ref 0–53)
AST: 20 U/L (ref 0–37)
Albumin: 4.4 g/dL (ref 3.5–5.2)
Alkaline Phosphatase: 52 U/L (ref 39–117)
BUN: 8 mg/dL (ref 6–23)
CO2: 29 mEq/L (ref 19–32)
Calcium: 9.8 mg/dL (ref 8.4–10.5)
Chloride: 104 mEq/L (ref 96–112)
Creatinine, Ser: 1.1 mg/dL (ref 0.40–1.50)
GFR: 79.23 mL/min (ref >60.00)
Glucose, Bld: 92 mg/dL (ref 70–99)
Potassium: 4.5 mEq/L (ref 3.5–5.1)
Sodium: 140 mEq/L (ref 135–145)
Total Bilirubin: 0.9 mg/dL (ref 0.2–1.2)
Total Protein: 6.9 g/dL (ref 6.0–8.3)

## 2020-09-17 LAB — LIPID PANEL
Cholesterol: 184 mg/dL (ref 0–200)
HDL: 61 mg/dL (ref >39.00)
LDL Cholesterol: 111 mg/dL — ABNORMAL HIGH (ref 0–99)
NonHDL: 123.31
Total CHOL/HDL Ratio: 3
Triglycerides: 62 mg/dL (ref 0.0–149.0)
VLDL: 12.4 mg/dL (ref 0.0–40.0)

## 2020-09-17 MED ORDER — ALPRAZOLAM 0.5 MG PO TABS: 0.5000 mg | ORAL_TABLET | Freq: Two times a day (BID) | ORAL | 0 refills | Status: DC | PRN

## 2020-09-17 NOTE — Progress Notes (Signed)
Subjective:    Patient ID: Chad Mejia, male    DOB: 17-Jul-1970, 50 y.o.   MRN: [id number]  HPI  Patient presents today for complete physical.  Immunizations:pfizer x 3, Tdap 2016, had flu shot.  Diet: Trying to get back on track with his diet.   Wt Readings from Last 3 Encounters:  09/17/20 270 lb (122.5 kg)  09/05/20 271 lb (122.9 kg)  08/22/20 271 lb (122.9 kg)  Exercise: some walking Colonoscopy:3/24 Vision:  due Dental:  overdue   Review of Systems  Constitutional: Negative for unexpected weight change.  HENT: Negative for rhinorrhea.   Eyes: Positive for visual disturbance (some blurriness with reading).  Respiratory: Negative for shortness of breath.   Cardiovascular: Negative for chest pain.  Gastrointestinal: Negative for blood in stool, constipation and diarrhea.  Genitourinary: Negative for difficulty urinating, dysuria, frequency and hematuria.  Musculoskeletal: Negative for arthralgias and myalgias.  Skin: Positive for rash (mild acne facial).  Neurological: Negative for headaches.  Hematological: Negative for adenopathy.  Psychiatric/Behavioral:       Reports mood is stable       Past Medical History:  Diagnosis Date  . Anxiety   . Depression   . GERD (gastroesophageal reflux disease)   . History of migraine   . Hyperlipidemia   . Hypertension      Social History   Socioeconomic History  . Marital status: Single    Spouse name: Not on file  . Number of children: Not on file  . Years of education: Not on file  . Highest education level: Not on file  Occupational History  . Not on file  Tobacco Use  . Smoking status: Passive Smoke Exposure - Never Smoker  . Smokeless tobacco: Never Used  Vaping Use  . Vaping Use: Never used  Substance and Sexual Activity  . Alcohol use: Yes    Comment: rarely  . Drug use: No  . Sexual activity: Not on file  Other Topics Concern  . 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     Single, no children   Lives with friend   Has a cat   Enjoys painting,writing, reading   Buyer, retail degree   Social Determinants of Health   Financial Resource Strain: Not on file  Food Insecurity: Not on file  Transportation Needs: Not on file  Physical Activity: Not on file  Stress: Not on file  Social Connections: Not on file  Intimate Partner Violence: Not on file    Past Surgical History:  Procedure Laterality Date  . ELBOW SURGERY Left    "shattered elbow as a child"    Family History  Problem Relation Age of Onset  . Hyperlipidemia Mother   . Hypertension Mother   . Arthritis Maternal Grandmother   . Schizophrenia Maternal Aunt   . Drug abuse Sister        died from drug overdose, 1/2 sister  . Colon cancer Neg Hx   . Colon polyps Neg Hx   . Esophageal cancer Neg Hx   . Rectal cancer Neg Hx   . Stomach cancer Neg Hx     No Known Allergies  Current Outpatient Medications on File Prior to Visit  Medication Sig Dispense Refill  . ALPRAZolam (XANAX) 0.5 MG tablet Take 1 tablet (0.5 mg total) by mouth 2 (two) times daily as needed for anxiety. 30 tablet 0  . amLODipine (NORVASC) 10 MG tablet Take 1  tablet (10 mg total) by mouth daily. 90 tablet 1  . aspirin EC 81 MG tablet Take 81 mg by mouth daily. Does not take consistently.    . benzoyl peroxide-erythromycin (BENZAMYCIN) gel Apply topically 2 (two) times daily. 23.3 g 5  . busPIRone (BUSPAR) 15 MG tablet Take 1 tablet (15 mg total) by mouth 2 (two) times daily. 180 tablet 1  . lisinopril (ZESTRIL) 10 MG tablet TAKE 1/2 TABLET(5 MG) BY MOUTH DAILY 45 tablet 1  . VITAMIN D PO Take by mouth.     No current facility-administered medications on file prior to visit.    BP 130/85 (BP Location: Left Arm, Patient Position: Sitting, Cuff Size: Normal)   Pulse 71   Temp 97.6 F (36.4 C)   Resp 17   Ht 5\' 10"  (1.778 m)   Wt 270 lb (122.5 kg)   SpO2 98%   BMI 38.74 kg/m     Objective:   Physical Exam Physical Exam  Constitutional: He is oriented to person, place, and time. He appears well-developed and well-nourished. No distress.  HENT:  Head: Normocephalic and atraumatic.  Right Ear: Tympanic membrane and ear canal normal.  Left Ear: Tympanic membrane and ear canal normal.  Mouth/Throat: Not examined- pt wearing mask Eyes: Pupils are equal, round, and reactive to light. No scleral icterus.  Neck: Normal range of motion. No thyromegaly present.  Cardiovascular: Normal rate and regular rhythm.   No murmur heard. Pulmonary/Chest: Effort normal and breath sounds normal. No respiratory distress. He has no wheezes. He has no rales. He exhibits no tenderness.  Abdominal: Soft. Bowel sounds are normal. He exhibits no distension and no mass. There is no tenderness. There is no rebound and no guarding.  Musculoskeletal: He exhibits no edema.  Lymphadenopathy:    He has no cervical adenopathy.  Neurological: He is alert and oriented to person, place, and time. He has normal patellar reflexes. He exhibits normal muscle tone. Coordination normal.  Skin: Skin is warm and dry.  Psychiatric: He has a normal mood and affect. His behavior is normal. Judgment and thought content normal.           Assessment & Plan:   Preventative care- Discussed healthy diet, exercise, weight loss. Immunizations reviewed and up to date. Colo up to date.  Labs as ordered.    This visit occurred during the SARS-CoV-2 public health emergency.  Safety protocols were in place, including screening questions prior to the visit, additional usage of staff PPE, and extensive cleaning of exam room while observing appropriate contact time as indicated for disinfecting solutions.          Assessment & Plan:

## 2020-09-17 NOTE — Patient Instructions (Addendum)
Please scheduled follow up dental and vision exams.    Preventive Care 23-49 Years Old, Male Preventive care refers to lifestyle choices and visits with your health care provider that can promote health and wellness. This includes:  A yearly physical exam. This is also called an annual wellness visit.  Regular dental and eye exams.  Immunizations.  Screening for certain conditions.  Healthy lifestyle choices, such as: ? Eating a healthy diet. ? Getting regular exercise. ? Not using drugs or products that contain nicotine and tobacco. ? Limiting alcohol use. What can I expect for my preventive care visit? Physical exam Your health care provider will check your:  Height and weight. These may be used to calculate your BMI (body mass index). BMI is a measurement that tells if you are at a healthy weight.  Heart rate and blood pressure.  Body temperature.  Skin for abnormal spots. Counseling Your health care provider may ask you questions about your:  Past medical problems.  Family's medical history.  Alcohol, tobacco, and drug use.  Emotional well-being.  Home life and relationship well-being.  Sexual activity.  Diet, exercise, and sleep habits.  Work and work Statistician.  Access to firearms. What immunizations do I need? Vaccines are usually given at various ages, according to a schedule. Your health care provider will recommend vaccines for you based on your age, medical history, and lifestyle or other factors, such as travel or where you work.   What tests do I need? Blood tests  Lipid and cholesterol levels. These may be checked every 5 years, or more often if you are over 53 years old.  Hepatitis C test.  Hepatitis B test. Screening  Lung cancer screening. You may have this screening every year starting at age 12 if you have a 30-pack-year history of smoking and currently smoke or have quit within the past 15 years.  Prostate cancer screening.  Recommendations will vary depending on your family history and other risks.  Genital exam to check for testicular cancer or hernias.  Colorectal cancer screening. ? All adults should have this screening starting at age 82 and continuing until age 88. ? Your health care provider may recommend screening at age 3 if you are at increased risk. ? You will have tests every 1-10 years, depending on your results and the type of screening test.  Diabetes screening. ? This is done by checking your blood sugar (glucose) after you have not eaten for a while (fasting). ? You may have this done every 1-3 years.  STD (sexually transmitted disease) testing, if you are at risk. Follow these instructions at home: Eating and drinking  Eat a diet that includes fresh fruits and vegetables, whole grains, lean protein, and low-fat dairy products.  Take vitamin and mineral supplements as recommended by your health care provider.  Do not drink alcohol if your health care provider tells you not to drink.  If you drink alcohol: ? Limit how much you have to 0-2 drinks a day. ? Be aware of how much alcohol is in your drink. In the [place] one drink equals one 12 oz bottle of beer (355 mL), one 5 oz glass of wine (148 mL), or one 1 oz glass of hard liquor (44 mL).   Lifestyle  Take daily care of your teeth and gums. Brush your teeth every morning and night with fluoride toothpaste. Floss one time each day.  Stay active. Exercise for at least 30 minutes 5 or more days each  week.  Do not use any products that contain nicotine or tobacco, such as cigarettes, e-cigarettes, and chewing tobacco. If you need help quitting, ask your health care provider.  Do not use drugs.  If you are sexually active, practice safe sex. Use a condom or other form of protection to prevent STIs (sexually transmitted infections).  If told by your health care provider, take low-dose aspirin daily starting at age 84.  Find healthy ways  to cope with stress, such as: ? Meditation, yoga, or listening to music. ? Journaling. ? Talking to a trusted person. ? Spending time with friends and family. Safety  Always wear your seat belt while driving or riding in a vehicle.  Do not drive: ? If you have been drinking alcohol. Do not ride with someone who has been drinking. ? When you are tired or distracted. ? While texting.  Wear a helmet and other protective equipment during sports activities.  If you have firearms in your house, make sure you follow all gun safety procedures. What's next?  Go to your health care provider once a year for an annual wellness visit.  Ask your health care provider how often you should have your eyes and teeth checked.  Stay up to date on all vaccines. This information is not intended to replace advice given to you by your health care provider. Make sure you discuss any questions you have with your health care provider. Document Revised: 02/28/2019 Document Reviewed: 05/26/2018 Elsevier Patient Education  2021 [employer].

## 2020-10-23 ENCOUNTER — Other Ambulatory Visit: Payer: Self-pay | Admitting: Family

## 2020-10-23 NOTE — Telephone Encounter (Signed)
Requesting: alprazolam 0.5mg  Contract:05/29/2019 UDS: 06/18/2020 Last Visit: 06/18/20 Next Visit: 03/19/2021 Last Refill: 09/17/2020 #30 and 0RF Pt sig: 1 tab bid prn  Please Advise

## 2020-10-24 MED ORDER — ALPRAZOLAM 0.5 MG PO TABS: 0.5000 mg | ORAL_TABLET | Freq: Two times a day (BID) | ORAL | 0 refills | Status: DC | PRN

## 2020-11-27 ENCOUNTER — Other Ambulatory Visit: Payer: Self-pay | Admitting: Family

## 2020-11-27 MED ORDER — ALPRAZOLAM 0.5 MG PO TABS: 0.5000 mg | ORAL_TABLET | Freq: Two times a day (BID) | ORAL | 0 refills | Status: DC | PRN

## 2020-11-27 NOTE — Telephone Encounter (Signed)
Requesting: alprazolam Contract: 06/18/20 UDS: 06/18/20 Last Visit: 09/17/20 Next Visit: 03/19/21 Last Refill: 10/24/20  Please Advise

## 2020-12-21 ENCOUNTER — Other Ambulatory Visit: Payer: Self-pay | Admitting: Family

## 2021-01-01 ENCOUNTER — Other Ambulatory Visit: Payer: Self-pay | Admitting: Family

## 2021-01-02 MED ORDER — ALPRAZOLAM 0.5 MG PO TABS: 0.5000 mg | ORAL_TABLET | Freq: Two times a day (BID) | ORAL | 0 refills | Status: DC | PRN

## 2021-01-02 NOTE — Telephone Encounter (Signed)
Requesting: alprazolam 0.5mg  Contract: 05/29/2019 UDS: 06/18/2020 Last Visit:09/17/2020 Next Visit: 03/19/2021 Last Refill: 11/27/2020 # 30 and 0RF  Please Advise

## 2021-02-04 ENCOUNTER — Other Ambulatory Visit: Payer: Self-pay | Admitting: Family

## 2021-02-04 MED ORDER — ALPRAZOLAM 0.5 MG PO TABS: 0.5000 mg | ORAL_TABLET | Freq: Two times a day (BID) | ORAL | 0 refills | Status: DC | PRN

## 2021-02-04 NOTE — Telephone Encounter (Signed)
Patient is requesting a refill of the following medications: Requested Prescriptions   Pending Prescriptions Disp Refills   ALPRAZolam (XANAX) 0.5 MG tablet 30 tablet 0    Sig: Take 1 tablet (0.5 mg total) by mouth 2 (two) times daily as needed for anxiety.    Date of patient request: 02/04/21 Last office visit: 09/17/20 Date of last refill: 02/04/21 Last refill amount: 30 + 0 Follow up time period per chart: 03/19/21

## 2021-02-18 ENCOUNTER — Other Ambulatory Visit: Payer: Self-pay | Admitting: Family

## 2021-02-20 ENCOUNTER — Encounter: Payer: Self-pay | Admitting: Family

## 2021-03-10 ENCOUNTER — Other Ambulatory Visit: Payer: Self-pay | Admitting: Family

## 2021-03-10 MED ORDER — ALPRAZOLAM 0.5 MG PO TABS: 0.5000 mg | ORAL_TABLET | Freq: Two times a day (BID) | ORAL | 0 refills | Status: DC | PRN

## 2021-03-10 NOTE — Telephone Encounter (Signed)
Requesting: alprazolam Contract: 06/18/20 UDS: 06/18/20 Last Visit: 09/17/20 Next Visit: 03/19/21 Last Refill: 02/04/21  Please Advise

## 2021-03-19 ENCOUNTER — Ambulatory Visit: Payer: BLUE CROSS/BLUE SHIELD | Admitting: Family

## 2021-03-19 ENCOUNTER — Encounter: Payer: Self-pay | Admitting: Family

## 2021-03-19 ENCOUNTER — Other Ambulatory Visit: Payer: Self-pay

## 2021-03-19 VITALS — BP 120/75 | HR 63 | Temp 98.0°F | Resp 18 | Ht 70.0 in | Wt 273.0 lb

## 2021-03-19 DIAGNOSIS — Z23 Encounter for immunization: Secondary | ICD-10-CM

## 2021-03-19 DIAGNOSIS — I10 Essential (primary) hypertension: Secondary | ICD-10-CM

## 2021-03-19 DIAGNOSIS — F419 Anxiety disorder, unspecified: Secondary | ICD-10-CM | POA: Insufficient documentation

## 2021-03-19 DIAGNOSIS — L709 Acne, unspecified: Secondary | ICD-10-CM | POA: Diagnosis not present

## 2021-03-19 LAB — BASIC METABOLIC PANEL
BUN: 9 mg/dL (ref 6–23)
CO2: 28 mEq/L (ref 19–32)
Calcium: 9.6 mg/dL (ref 8.4–10.5)
Chloride: 102 mEq/L (ref 96–112)
Creatinine, Ser: 1.13 mg/dL (ref 0.40–1.50)
GFR: 76.44 mL/min (ref >60.00)
Glucose, Bld: 93 mg/dL (ref 70–99)
Potassium: 4.8 mEq/L (ref 3.5–5.1)
Sodium: 136 mEq/L (ref 135–145)

## 2021-03-19 NOTE — Assessment & Plan Note (Signed)
Initial bp reading was elevated.  Repeat bp WNL. Continue amlodipine. Advised pt to check bp at least once a week and let me know if dbp is running >90,

## 2021-03-19 NOTE — Assessment & Plan Note (Signed)
>>  ASSESSMENT AND PLAN FOR HYPERTENSION WRITTEN ON 03/19/2021  7:21 AM BY O'SULLIVAN, Damel Querry, NP  Initial bp reading was elevated.  Repeat bp WNL. Continue amlodipine. Advised pt to check bp at least once a week and let me know if dbp is running >90,

## 2021-03-19 NOTE — Assessment & Plan Note (Signed)
Stable on Buspar and prn xanax. Continue same. Controlled substance contract is up to date.

## 2021-03-19 NOTE — Assessment & Plan Note (Signed)
Stable, continue benzamycin gel prn.

## 2021-03-19 NOTE — Progress Notes (Signed)
Subjective:     Patient ID: Chad Mejia, male    DOB: 12/05/1970, 50 y.o.   MRN: [id number]  Chief Complaint  Patient presents with   Follow-up    6 month    HPI Patient is in today for follow up.  HTN- reports good compliance with amlodipine 10mg  once daily.    BP Readings from Last 3 Encounters:  03/19/21 120/75  09/17/20 130/85  09/05/20 115/70   Anxiety- pt continues buspar and as needed xanax. He needs xanax most days while at work. He rarely needs at home.   Acne- reports acne has been stable.  Has not needed to use benzamycin gel in some time but when he needs it he finds it helpful.   There are no preventive care reminders to display for this patient.   Past Medical History:  Diagnosis Date   Anxiety    Depression    GERD (gastroesophageal reflux disease)    History of migraine    Hyperlipidemia    Hypertension     Past Surgical History:  Procedure Laterality Date   ELBOW SURGERY Left    "shattered elbow as a child"    Family History  Problem Relation Age of Onset   Hyperlipidemia Mother    Hypertension Mother    Arthritis Maternal Grandmother    Schizophrenia Maternal Aunt    Drug abuse Sister        died from drug overdose, 1/2 sister   Colon cancer Neg Hx    Colon polyps Neg Hx    Esophageal cancer Neg Hx    Rectal cancer Neg Hx    Stomach cancer Neg Hx     Social History   Socioeconomic History   Marital status: Single    Spouse name: Not on file   Number of children: Not on file   Years of education: Not on file   Highest education level: Not on file  Occupational History   Not on file  Tobacco Use   Smoking status: Never    Passive exposure: Yes   Smokeless tobacco: Never  Vaping Use   Vaping Use: Never used  Substance and Sexual Activity   Alcohol use: Yes    Comment: rarely   Drug use: No   Sexual activity: Not on file  Other Topics Concern   Not on file  Social History Narrative   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     Single, no children   Lives with friend   Has a cat   Enjoys painting,writing, reading   Buyer, retail degree   Social Determinants of Health   Financial Resource Strain: Not on file  Food Insecurity: Not on file  Transportation Needs: Not on file  Physical Activity: Not on file  Stress: Not on file  Social Connections: Not on file  Intimate Partner Violence: Not on file    Outpatient Medications Prior to Visit  Medication Sig Dispense Refill   ALPRAZolam (XANAX) 0.5 MG tablet Take 1 tablet (0.5 mg total) by mouth 2 (two) times daily as needed for anxiety. 30 tablet 0   amLODipine (NORVASC) 10 MG tablet TAKE 1 TABLET(10 MG) BY MOUTH DAILY 90 tablet 1   aspirin EC 81 MG tablet Take 81 mg by mouth daily. Does not take consistently.     benzoyl peroxide-erythromycin (BENZAMYCIN) gel Apply topically 2 (two) times daily. 23.3 g 5   busPIRone (BUSPAR) 15 MG tablet TAKE 1 TABLET(15 MG) BY MOUTH TWICE DAILY 180  tablet 1   lisinopril (ZESTRIL) 10 MG tablet TAKE 1/2 TABLET(5 MG) BY MOUTH DAILY 45 tablet 1   VITAMIN D PO Take by mouth.     No facility-administered medications prior to visit.    No Known Allergies  ROS    See HPI  Objective:    Physical Exam Constitutional:      General: He is not in acute distress.    Appearance: He is well-developed.  HENT:     Head: Normocephalic and atraumatic.  Cardiovascular:     Rate and Rhythm: Normal rate and regular rhythm.     Heart sounds: No murmur heard. Pulmonary:     Effort: Pulmonary effort is normal. No respiratory distress.     Breath sounds: Normal breath sounds. No wheezing or rales.  Skin:    General: Skin is warm and dry.  Neurological:     Mental Status: He is alert and oriented to person, place, and time.  Psychiatric:        Behavior: Behavior normal.        Thought Content: Thought content normal.    BP 120/75   Pulse 63   Temp 98 F (36.7 C)   Resp 18   Ht 5\' 10"  (1.778 m)   Wt 273  lb (123.8 kg)   SpO2 97%   BMI 39.17 kg/m  Wt Readings from Last 3 Encounters:  03/19/21 273 lb (123.8 kg)  09/17/20 270 lb (122.5 kg)  09/05/20 271 lb (122.9 kg)       Assessment & Plan:   Problem List Items Addressed This Visit       Unprioritized   HTN (hypertension)   Relevant Orders   Basic Metabolic Panel (BMET)   Anxiety    Stable on Buspar and prn xanax. Continue same. Controlled substance contract is up to date.       Acne    Stable, continue benzamycin gel prn.       Other Visit Diagnoses     Need for influenza vaccination    -  Primary   Relevant Orders   Flu Vaccine QUAD 27mo+IM (Fluarix, Fluzone & Alfiuria Quad PF) (Completed)       I am having Chad Mejia maintain his benzoyl peroxide-erythromycin, VITAMIN D PO, lisinopril, aspirin EC, busPIRone, amLODipine, and ALPRAZolam.  No orders of the defined types were placed in this encounter.

## 2021-04-10 ENCOUNTER — Other Ambulatory Visit: Payer: Self-pay | Admitting: Family

## 2021-04-10 MED ORDER — ALPRAZOLAM 0.5 MG PO TABS: 0.5000 mg | ORAL_TABLET | Freq: Two times a day (BID) | ORAL | 0 refills | Status: DC | PRN

## 2021-04-10 NOTE — Telephone Encounter (Signed)
Patient is requesting a refill of the following medications: Requested Prescriptions   Pending Prescriptions Disp Refills   ALPRAZolam (XANAX) 0.5 MG tablet 30 tablet 0    Sig: Take 1 tablet (0.5 mg total) by mouth 2 (two) times daily as needed for anxiety.    Date of patient request: 04/09/21 Last office visit: 03/19/21 Last refill amount: 30 Follow up time period per chart: 06/20/20   USD & Contract: 06/18/20  Non-opioid controlled substance agreement signed today and UDS sample given. Tf,cma

## 2021-04-21 ENCOUNTER — Encounter: Payer: Self-pay | Admitting: Family

## 2021-05-12 ENCOUNTER — Other Ambulatory Visit: Payer: Self-pay | Admitting: Family

## 2021-05-12 MED ORDER — ALPRAZOLAM 0.5 MG PO TABS: 0.5000 mg | ORAL_TABLET | Freq: Two times a day (BID) | ORAL | 0 refills | Status: DC | PRN

## 2021-05-12 NOTE — Telephone Encounter (Signed)
Requesting: alprazolam  Contract: 06/18/20 UDS: 06/18/20 Last Visit: 03/19/21 Next Visit: 06/20/21 Last Refill: 04/10/21  Please Advise

## 2021-06-09 ENCOUNTER — Other Ambulatory Visit: Payer: Self-pay | Admitting: Family

## 2021-06-10 MED ORDER — ALPRAZOLAM 0.5 MG PO TABS: 0.5000 mg | ORAL_TABLET | Freq: Two times a day (BID) | ORAL | 0 refills | Status: DC | PRN

## 2021-06-10 NOTE — Telephone Encounter (Signed)
Requesting: alprazolam Contract: 06/18/20 UDS: 06/18/20 Last Visit: 03/19/21 Next Visit: 06/20/21  Last Refill: 05/12/21  Please Advise

## 2021-06-11 ENCOUNTER — Encounter (HOSPITAL_COMMUNITY): Payer: Self-pay

## 2021-06-11 ENCOUNTER — Emergency Department (HOSPITAL_COMMUNITY)
Admission: EM | Admit: 2021-06-11 | Discharge: 2021-06-11 | Disposition: A | Discharge status: Home / Self Care | Payer: BC Managed Care – PPO | Attending: Emergency Medicine | Admitting: Emergency Medicine

## 2021-06-11 ENCOUNTER — Other Ambulatory Visit: Payer: Self-pay

## 2021-06-11 ENCOUNTER — Emergency Department (HOSPITAL_COMMUNITY): Payer: BC Managed Care – PPO

## 2021-06-11 DIAGNOSIS — Z7982 Long term (current) use of aspirin: Secondary | ICD-10-CM | POA: Diagnosis not present

## 2021-06-11 DIAGNOSIS — Z79899 Other long term (current) drug therapy: Secondary | ICD-10-CM | POA: Insufficient documentation

## 2021-06-11 DIAGNOSIS — Z7722 Contact with and (suspected) exposure to environmental tobacco smoke (acute) (chronic): Secondary | ICD-10-CM | POA: Insufficient documentation

## 2021-06-11 DIAGNOSIS — I10 Essential (primary) hypertension: Secondary | ICD-10-CM | POA: Insufficient documentation

## 2021-06-11 DIAGNOSIS — M5459 Other low back pain: Secondary | ICD-10-CM | POA: Diagnosis not present

## 2021-06-11 DIAGNOSIS — N3289 Other specified disorders of bladder: Secondary | ICD-10-CM | POA: Diagnosis not present

## 2021-06-11 DIAGNOSIS — R319 Hematuria, unspecified: Secondary | ICD-10-CM | POA: Diagnosis not present

## 2021-06-11 DIAGNOSIS — M545 Low back pain, unspecified: Secondary | ICD-10-CM | POA: Diagnosis not present

## 2021-06-11 DIAGNOSIS — R109 Unspecified abdominal pain: Secondary | ICD-10-CM | POA: Diagnosis not present

## 2021-06-11 DIAGNOSIS — I700 Atherosclerosis of aorta: Secondary | ICD-10-CM | POA: Diagnosis not present

## 2021-06-11 LAB — URINALYSIS, ROUTINE W REFLEX MICROSCOPIC
Bacteria, UA: NONE SEEN
Bilirubin Urine: NEGATIVE
Glucose, UA: NEGATIVE mg/dL
Ketones, ur: NEGATIVE mg/dL
Leukocytes,Ua: NEGATIVE
Nitrite: NEGATIVE
Protein, ur: NEGATIVE mg/dL
Specific Gravity, Urine: 1.019 (ref 1.005–1.030)
pH: 5 (ref 5.0–8.0)

## 2021-06-11 MED ORDER — CYCLOBENZAPRINE HCL 10 MG PO TABS: 10.0000 mg | ORAL_TABLET | Freq: Two times a day (BID) | ORAL | 0 refills | Status: DC | PRN

## 2021-06-11 MED ORDER — IBUPROFEN 600 MG PO TABS: 600.0000 mg | ORAL_TABLET | Freq: Four times a day (QID) | ORAL | 0 refills | Status: DC | PRN

## 2021-06-11 MED ORDER — KETOROLAC TROMETHAMINE 30 MG/ML IJ SOLN
30.0000 mg | Freq: Once | INTRAMUSCULAR | Status: AC
  Administered 2021-06-11: 09:00:00 30 mg via INTRAMUSCULAR
  Filled 2021-06-11: qty 1

## 2021-06-11 NOTE — ED Provider Notes (Signed)
[facility] DEPT Provider Note   CSN: [id number] Arrival date & time: 06/11/21  0448     History Chief Complaint  Patient presents with   Back Pain    Chad Mejia is a 50 y.o. male.  The history is provided by the patient and medical records. No language interpreter was used.  Back Pain  50 year old obese male with significant history of hypertension, hyperlipidemia, GERD, who presents complaining of low back pain.  Patient report for the past 2 weeks he has had intermittent pain to his right lower back.  Pain is described as a dull but occasional stabbing sensation deep in his right lower back sometimes radiates towards his abdomen.  Has been waxing waning but for the past 2 days it has become a lot more intense.  No associated fever chills no nausea vomiting diarrhea no chest pain shortness of breath no dysuria or hematuria and pain does not radiates down his leg.  No bowel bladder incontinence or saddle anesthesia.  No history of kidney stone.  He does admits to heavy lifting at work which he felt could contribute to his symptom.  He tries heating and icing but noticed no significant improvement.  States when the pain is intense he has to pace around the house.  Currently rates his pain a 7 out of 10  Past Medical History:  Diagnosis Date   Anxiety    Depression    GERD (gastroesophageal reflux disease)    History of migraine    Hyperlipidemia    Hypertension     Patient Active Problem List   Diagnosis Date Noted   Anxiety 03/19/2021   Atypical chest pain 02/07/2018   Snoring 02/07/2018   Major depression 07/29/2015   Preventative health care 04/29/2015   Morbid obesity ([place]) 04/30/2014   Somnolence 01/01/2014   HTN (hypertension) 06/06/2013   Acne 06/06/2013   Hyperlipidemia 06/06/2013   Hypertension 05/11/2011   Routine adult health maintenance 05/11/2011   GERD (gastroesophageal reflux disease) 04/26/2011    Past Surgical  History:  Procedure Laterality Date   ELBOW SURGERY Left    "shattered elbow as a child"       Family History  Problem Relation Age of Onset   Hyperlipidemia Mother    Hypertension Mother    Arthritis Maternal Grandmother    Schizophrenia Maternal Aunt    Drug abuse Sister        died from drug overdose, 1/2 sister   Colon cancer Neg Hx    Colon polyps Neg Hx    Esophageal cancer Neg Hx    Rectal cancer Neg Hx    Stomach cancer Neg Hx     Social History   Tobacco Use   Smoking status: Never    Passive exposure: Yes   Smokeless tobacco: Never  Vaping Use   Vaping Use: Never used  Substance Use Topics   Alcohol use: Yes    Comment: rarely   Drug use: No    Home Medications Prior to Admission medications   Medication Sig Start Date End Date Taking? Authorizing Provider  ALPRAZolam Duanne Moron) 0.5 MG tablet Take 1 tablet (0.5 mg total) by mouth 2 (two) times daily as needed for anxiety. 06/10/21   Debbrah Alar, NP  amLODipine (NORVASC) 10 MG tablet TAKE 1 TABLET(10 MG) BY MOUTH DAILY 02/18/21   Debbrah Alar, NP  aspirin EC 81 MG tablet Take 81 mg by mouth daily. Does not take consistently.    [provider]  benzoyl peroxide-erythromycin (BENZAMYCIN) gel Apply topically 2 (two) times daily. 02/24/19   Debbrah Alar, NP  busPIRone (BUSPAR) 15 MG tablet TAKE 1 TABLET(15 MG) BY MOUTH TWICE DAILY 12/21/20   Debbrah Alar, NP  lisinopril (ZESTRIL) 10 MG tablet TAKE 1/2 TABLET(5 MG) BY MOUTH DAILY 09/04/20   Debbrah Alar, NP  VITAMIN D PO Take by mouth.    [provider]    Allergies    Patient has no known allergies.  Review of Systems   Review of Systems  Musculoskeletal:  Positive for back pain.  All other systems reviewed and are negative.  Physical Exam Updated Vital Signs BP (!) 132/93    Pulse 78    Temp 98.9 F (37.2 C) (Oral)    Resp 16    SpO2 100%   Physical Exam Vitals and nursing note reviewed.   Constitutional:      General: He is not in acute distress.    Appearance: He is well-developed.     Comments: Appears uncomfortable, pacing around the room  HENT:     Head: Atraumatic.  Eyes:     Conjunctiva/sclera: Conjunctivae normal.  Cardiovascular:     Rate and Rhythm: Normal rate and regular rhythm.     Pulses: Normal pulses.     Heart sounds: Normal heart sounds.  Pulmonary:     Effort: Pulmonary effort is normal.     Breath sounds: Normal breath sounds.  Abdominal:     Palpations: Abdomen is soft.     Tenderness: There is no abdominal tenderness. There is right CVA tenderness. There is no left CVA tenderness, guarding or rebound.     Hernia: No hernia is present.  Musculoskeletal:     Cervical back: Neck supple.     Comments: 5 out of 5 strength to bilateral lower extremities  Skin:    Findings: No rash.  Neurological:     Mental Status: He is alert. Mental status is at baseline.    ED Results / Procedures / Treatments   Labs (all labs ordered are listed, but only abnormal results are displayed) Labs Reviewed  URINALYSIS, ROUTINE W REFLEX MICROSCOPIC - Abnormal; Notable for the following components:      Result Value   Hgb urine dipstick MODERATE (*)    All other components within normal limits    EKG None  Radiology CT Renal Stone Study  Result Date: 06/11/2021 CLINICAL DATA:  Flank pain, kidney stone suspected EXAM: CT ABDOMEN AND PELVIS WITHOUT CONTRAST TECHNIQUE: Multidetector CT imaging of the abdomen and pelvis was performed following the standard protocol without IV contrast. COMPARISON:  None. FINDINGS: Lower chest: No acute abnormality. Hepatobiliary: Leretha Dykes of liver is only partially imaged. There is a too small to characterize low-density lesion probably reflecting a cyst. Gallbladder is unremarkable. No biliary dilatation. Pancreas: Unremarkable. Spleen: Unremarkable. Adrenals/Urinary Tract: Adrenals are unremarkable. No renal calculi. No  hydronephrosis. Ureters are normal in caliber. Bladder is poorly distended but unremarkable. Stomach/Bowel: Stomach is within normal limits. Bowel is normal in caliber. Normal appendix. Vascular/Lymphatic: Minimal aortic atherosclerosis. No enlarged nodes. Reproductive: Unremarkable. Other: No free fluid.  Abdominal wall is unremarkable. Musculoskeletal: No significant abnormality. IMPRESSION: No acute abnormality.  No urinary tract calculi. Electronically Signed   By: Macy Mis M.D.   On: 06/11/2021 09:04    Procedures Procedures   Medications Ordered in ED Medications  ketorolac (TORADOL) 30 MG/ML injection 30 mg (30 mg Intramuscular Given 06/11/21 9563)    ED Course  I have reviewed the triage vital signs and the nursing notes.  Pertinent labs & imaging results that were available during my care of the patient were reviewed by me and considered in my medical decision making (see chart for details).    MDM Rules/Calculators/A&P                         BP (!) 137/94 (BP Location: Right Arm)    Pulse 78    Temp 98.9 F (37.2 C) (Oral)    Resp 18    SpO2 100%      Final Clinical Impression(s) / ED Diagnoses Final diagnoses:  Acute right-sided low back pain without sciatica  Hematuria, unspecified type    Rx / DC Orders ED Discharge Orders          Ordered    ibuprofen (ADVIL) 600 MG tablet  Every 6 hours PRN        06/11/21 0939    cyclobenzaprine (FLEXERIL) 10 MG tablet  2 times daily PRN        06/11/21 0939           8:18 AM Patient here with progressive worsening right flank pain.  He also has blood in his urine.  He has blood in urine in the past.  He would likely benefit from a CT renal stone study to rule out kidney stone.  No overlying skin changes to suggest shingles.  No significant pain along his midline spine.  No red flags.  9:31 AM CT renal stone study without any acute abnormalities, no evidence of kidney stone.  Since patient endorse heavy lifting,  his pain certainly could be MSK related.  Low suspicion for dissection, or other acute abdominal pathology.  Doubt fracture or dislocation.  Doubt cauda equina.  Will provide symptomatic treatment but encourage patient to follow-up with urology for further assessments of his recurrent hematuria.   Chad Moras, PA-C 06/11/21 [place], [place], MD 06/18/21 [phone]

## 2021-06-11 NOTE — ED Triage Notes (Signed)
Pt reports with lower right sided back pain x 2 weeks. Pt states that he has been pushing cars and lifting heavy things at work.

## 2021-06-11 NOTE — Discharge Instructions (Signed)
You have been evaluated for your back pain.  Fortunately no evidence of any kidney stones or any abnormalities within your CT scan.  Your pain could be related to musculoskeletal pain.  Please take medication prescribed.  Incidentally, your urine did shows evidence of blood.  This appears to be a recurrence finding.  Please follow-up with urologist for further assessment.  Return if you have any concern.

## 2021-06-19 ENCOUNTER — Encounter: Payer: Self-pay | Admitting: Family

## 2021-06-20 ENCOUNTER — Ambulatory Visit: Payer: BC Managed Care – PPO | Admitting: Family

## 2021-06-20 VITALS — BP 123/74 | HR 64 | Temp 98.2°F | Resp 16 | Ht 70.0 in | Wt 260.0 lb

## 2021-06-20 DIAGNOSIS — I10 Essential (primary) hypertension: Secondary | ICD-10-CM

## 2021-06-20 DIAGNOSIS — F419 Anxiety disorder, unspecified: Secondary | ICD-10-CM

## 2021-06-20 NOTE — Progress Notes (Signed)
Subjective:     Patient ID: Chad Mejia, male    DOB: 1970/12/02, 51 y.o.   MRN: [id number]  Chief Complaint  Patient presents with   Hypertension    Here for follow up   Anxiety    Here for follow up    HPI Patient is in today for follow up.  HTN- continues amlodipine 10mg . Has been watching his BP daily at home and the majority of his SBP readings have been 120-140.   BP Readings from Last 3 Encounters:  06/20/21 123/74  06/11/21 110/76  03/19/21 120/75   Anxiety- overall has been well controlled. He is getting married in a few weeks which he is looking forward to. This has been a source of some increased stress. He is using xanax several times a week, typically at work in the AM.  Rarely needs a second later in the day.  Wt Readings from Last 3 Encounters:  06/20/21 260 lb (117.9 kg)  03/19/21 273 lb (123.8 kg)  09/17/20 270 lb (122.5 kg)     Health Maintenance Due  Topic Date Due   COVID-19 Vaccine (5 - Booster for Pfizer series) 04/19/2021    Past Medical History:  Diagnosis Date   Anxiety    Depression    GERD (gastroesophageal reflux disease)    History of migraine    Hyperlipidemia    Hypertension     Past Surgical History:  Procedure Laterality Date   ELBOW SURGERY Left    "shattered elbow as a child"    Family History  Problem Relation Age of Onset   Hyperlipidemia Mother    Hypertension Mother    Arthritis Maternal Grandmother    Schizophrenia Maternal Aunt    Drug abuse Sister        died from drug overdose, 1/2 sister   Colon cancer Neg Hx    Colon polyps Neg Hx    Esophageal cancer Neg Hx    Rectal cancer Neg Hx    Stomach cancer Neg Hx     Social History   Socioeconomic History   Marital status: Single    Spouse name: Not on file   Number of children: Not on file   Years of education: Not on file   Highest education level: Not on file  Occupational History   Not on file  Tobacco Use   Smoking status: Never    Passive  exposure: Yes   Smokeless tobacco: Never  Vaping Use   Vaping Use: Never used  Substance and Sexual Activity   Alcohol use: Yes    Comment: rarely   Drug use: No   Sexual activity: Not on file  Other Topics Concern   Not on file  Social History Narrative   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     Single, no children   Lives with friend   Has a cat   Enjoys painting,writing, reading   Buyer, retail degree   Social Determinants of Health   Financial Resource Strain: Not on file  Food Insecurity: Not on file  Transportation Needs: Not on file  Physical Activity: Not on file  Stress: Not on file  Social Connections: Not on file  Intimate Partner Violence: Not on file    Outpatient Medications Prior to Visit  Medication Sig Dispense Refill   ALPRAZolam (XANAX) 0.5 MG tablet Take 1 tablet (0.5 mg total) by mouth 2 (two) times daily as needed for anxiety. 30 tablet 0   amLODipine (NORVASC) 10  MG tablet TAKE 1 TABLET(10 MG) BY MOUTH DAILY 90 tablet 1   aspirin EC 81 MG tablet Take 81 mg by mouth daily. Does not take consistently.     benzoyl peroxide-erythromycin (BENZAMYCIN) gel Apply topically 2 (two) times daily. 23.3 g 5   busPIRone (BUSPAR) 15 MG tablet TAKE 1 TABLET(15 MG) BY MOUTH TWICE DAILY 180 tablet 1   lisinopril (ZESTRIL) 10 MG tablet TAKE 1/2 TABLET(5 MG) BY MOUTH DAILY 45 tablet 1   VITAMIN D PO Take by mouth.     cyclobenzaprine (FLEXERIL) 10 MG tablet Take 1 tablet (10 mg total) by mouth 2 (two) times daily as needed for muscle spasms. 20 tablet 0   ibuprofen (ADVIL) 600 MG tablet Take 1 tablet (600 mg total) by mouth every 6 (six) hours as needed for moderate pain. 30 tablet 0   No facility-administered medications prior to visit.    No Known Allergies  ROS See HPI    Objective:    Physical Exam Constitutional:      General: He is not in acute distress.    Appearance: He is well-developed.  HENT:     Head: Normocephalic and atraumatic.   Cardiovascular:     Rate and Rhythm: Normal rate and regular rhythm.     Heart sounds: No murmur heard. Pulmonary:     Effort: Pulmonary effort is normal. No respiratory distress.     Breath sounds: Normal breath sounds. No wheezing or rales.  Skin:    General: Skin is warm and dry.  Neurological:     Mental Status: He is alert and oriented to person, place, and time.  Psychiatric:        Behavior: Behavior normal.        Thought Content: Thought content normal.    BP 123/74 (BP Location: Left Arm, Patient Position: Sitting, Cuff Size: Large)    Pulse 64    Temp 98.2 F (36.8 C) (Oral)    Resp 16    Ht 5\' 10"  (1.778 m)    Wt 260 lb (117.9 kg)    SpO2 98%    BMI 37.31 kg/m  Wt Readings from Last 3 Encounters:  06/20/21 260 lb (117.9 kg)  03/19/21 273 lb (123.8 kg)  09/17/20 270 lb (122.5 kg)       Assessment & Plan:   Problem List Items Addressed This Visit       Unprioritized   Hypertension    BP looks good. Continue amlodipine 10mg .       Anxiety - Primary    Stable. Continue buspar/prn xanax.  Update UDS.  Controlled substance contract is UTD.       Relevant Orders   DRUG MONITORING, PANEL 8 WITH CONFIRMATION, URINE    I have discontinued Doren Custard E. Marzella's ibuprofen and cyclobenzaprine. I am also having him maintain his benzoyl peroxide-erythromycin, VITAMIN D PO, lisinopril, aspirin EC, busPIRone, amLODipine, and ALPRAZolam.  No orders of the defined types were placed in this encounter.

## 2021-06-20 NOTE — Telephone Encounter (Signed)
Patient came in for visit in person

## 2021-06-20 NOTE — Patient Instructions (Signed)
Please complete lab work prior to leaving.   

## 2021-06-20 NOTE — Assessment & Plan Note (Signed)
>>  ASSESSMENT AND PLAN FOR HYPERTENSION WRITTEN ON 06/20/2021  7:27 AM BY O'SULLIVAN, Heston Widener, NP  BP looks good. Continue amlodipine 10mg .

## 2021-06-20 NOTE — Assessment & Plan Note (Signed)
Stable. Continue buspar/prn xanax.  Update UDS.  Controlled substance contract is UTD.

## 2021-06-20 NOTE — Assessment & Plan Note (Signed)
BP looks good. Continue amlodipine 10mg .

## 2021-06-24 LAB — DRUG MONITORING, PANEL 8 WITH CONFIRMATION, URINE
6 Acetylmorphine: NEGATIVE ng/mL (ref <10)
Alcohol Metabolites: NEGATIVE ng/mL (ref <500)
Alphahydroxyalprazolam: 176 ng/mL — ABNORMAL HIGH (ref <25)
Alphahydroxymidazolam: NEGATIVE ng/mL (ref <50)
Alphahydroxytriazolam: NEGATIVE ng/mL (ref <50)
Aminoclonazepam: NEGATIVE ng/mL (ref <25)
Amphetamines: NEGATIVE ng/mL (ref <500)
Benzodiazepines: POSITIVE ng/mL — AB (ref <100)
Buprenorphine, Urine: NEGATIVE ng/mL (ref <5)
Cocaine Metabolite: NEGATIVE ng/mL (ref <150)
Creatinine: 194.2 mg/dL (ref >=20.0)
Hydroxyethylflurazepam: NEGATIVE ng/mL (ref <50)
Lorazepam: NEGATIVE ng/mL (ref <50)
MDMA: NEGATIVE ng/mL (ref <500)
Marijuana Metabolite: NEGATIVE ng/mL (ref <20)
Nordiazepam: NEGATIVE ng/mL (ref <50)
Opiates: NEGATIVE ng/mL (ref <100)
Oxazepam: NEGATIVE ng/mL (ref <50)
Oxidant: NEGATIVE ug/mL (ref <200)
Oxycodone: NEGATIVE ng/mL (ref <100)
Temazepam: NEGATIVE ng/mL (ref <50)
pH: 5.6 (ref 4.5–9.0)

## 2021-06-24 LAB — DM TEMPLATE

## 2021-07-02 ENCOUNTER — Other Ambulatory Visit: Payer: Self-pay | Admitting: Family

## 2021-07-21 ENCOUNTER — Other Ambulatory Visit: Payer: Self-pay | Admitting: Family

## 2021-07-21 MED ORDER — ALPRAZOLAM 0.5 MG PO TABS: 0.5000 mg | ORAL_TABLET | Freq: Two times a day (BID) | ORAL | 0 refills | Status: DC | PRN

## 2021-07-21 NOTE — Telephone Encounter (Signed)
Requesting: alprazolam 0.5mg   Contract: 05/29/2019 UDS: 06/20/2021 Last Visit: 06/20/2021 Next Visit: 09/24/2021 Last Refill: 06/10/2021 #30 and 0RF  Please Advise

## 2021-08-18 ENCOUNTER — Other Ambulatory Visit: Payer: Self-pay | Admitting: Family

## 2021-08-28 ENCOUNTER — Other Ambulatory Visit: Payer: Self-pay | Admitting: Family

## 2021-08-29 ENCOUNTER — Other Ambulatory Visit: Payer: Self-pay | Admitting: Family

## 2021-08-29 MED ORDER — ALPRAZOLAM 0.5 MG PO TABS: 0.5000 mg | ORAL_TABLET | Freq: Two times a day (BID) | ORAL | 0 refills | Status: DC | PRN

## 2021-08-29 NOTE — Telephone Encounter (Signed)
Requesting:xanax 0.'5mg'$  ?Contract:05/29/19 ?UDS:06/20/21 ?Last Visit:06/20/21 ?Next Visit:09/24/21 ?Last Refill:07/21/21 ? ?Please Advise  ?

## 2021-09-24 ENCOUNTER — Ambulatory Visit (INDEPENDENT_AMBULATORY_CARE_PROVIDER_SITE_OTHER): Payer: BC Managed Care – PPO | Admitting: Family

## 2021-09-24 ENCOUNTER — Encounter: Payer: Self-pay | Admitting: Family

## 2021-09-24 VITALS — BP 120/64 | HR 60 | Resp 18 | Ht 71.0 in | Wt 269.0 lb

## 2021-09-24 DIAGNOSIS — I10 Essential (primary) hypertension: Secondary | ICD-10-CM

## 2021-09-24 DIAGNOSIS — Z0000 Encounter for general adult medical examination without abnormal findings: Secondary | ICD-10-CM | POA: Diagnosis not present

## 2021-09-24 LAB — COMPREHENSIVE METABOLIC PANEL
ALT: 16 U/L (ref 0–53)
AST: 16 U/L (ref 0–37)
Albumin: 4.3 g/dL (ref 3.5–5.2)
Alkaline Phosphatase: 53 U/L (ref 39–117)
BUN: 10 mg/dL (ref 6–23)
CO2: 29 mEq/L (ref 19–32)
Calcium: 9.7 mg/dL (ref 8.4–10.5)
Chloride: 103 mEq/L (ref 96–112)
Creatinine, Ser: 1.14 mg/dL (ref 0.40–1.50)
GFR: 75.36 mL/min (ref >60.00)
Glucose, Bld: 92 mg/dL (ref 70–99)
Potassium: 4.4 mEq/L (ref 3.5–5.1)
Sodium: 137 mEq/L (ref 135–145)
Total Bilirubin: 0.8 mg/dL (ref 0.2–1.2)
Total Protein: 6.5 g/dL (ref 6.0–8.3)

## 2021-09-24 NOTE — Progress Notes (Signed)
? ?Subjective:  ? ?By signing my name below, I, Shehryar Baig, attest that this documentation has been prepared under the direction and in the presence of Debbrah Alar, NP. 09/24/2021 ?   ? ? Patient ID: Chad Mejia, male    DOB: [birth year], 51 y.o.   MRN: [id number] ? ?Chief Complaint  ?Patient presents with  ? Annual Exam  ?  Doing well , no complaints  ? ? ?HPI ?Patient is in today for a comprehensive physical exam. ? ?Mood- He continues having varying moods. He is planning on seeing a couples counselor with his partner. His main issue is that his 59 yr old cat is not doing well and this is really upsetting him.  ?Cramps- He reports experiencing cramps in his hands and feet. ? ?He denies having any fever, new muscle pain, joint pain, new moles, congestion, sinus pain, sore throat, chest pain, palpations, cough, SOB, wheezing, n/v/d, constipation, blood in stool, dysuria, frequency, weight change, adenopathy, hematuria, depression, anxiety, headaches at this time. ?Social history: He has no recent surgical procedures in the past year. He has no change in his family medical history. He has 2 half brothers and 1 half sister. He reports his half sister passed away from drug abuse. He does not use tobacco products or vaping products. He does not drink alcohol. He does not use drugs. He has 1 male partner.  ?Immunizations: He is UTD on tetanus vaccines. He is UTD on flu vaccines this year. He is UTD on pfizer Covid-19 vaccines at this time.  ?Diet: He is trying to maintain a healthy diet. He struggles with stress eating and reports having increased stress due to his cat being in poor health this past month.  ?Exercise: He continues walking regularly for exercise.  ?Colonoscopy: Last completed 09/05/2020. Results showed: ?- The examined portion of the ileum was normal. ?- One 5 mm polyp in the cecum, removed with a cold snare. Resected and retrieved. ?- One 3 mm polyp in the ascending colon, removed with a  cold snare. Resected and retrieved. ?- Two 3 to 5 mm polyps in the transverse colon, removed with a cold snare. Resected and retrieved. ?- Diverticulosis in the sigmoid colon. ?- Small internal hemorrhoids. ?Otherwise results are normal.  ? ?Health Maintenance Due  ?Topic Date Due  ? COVID-19 Vaccine (5 - Booster for [place] series) 04/19/2021  ? ? ?Past Medical History:  ?Diagnosis Date  ? Anxiety   ? Depression   ? GERD (gastroesophageal reflux disease)   ? History of migraine   ? Hyperlipidemia   ? Hypertension   ? ? ?Past Surgical History:  ?Procedure Laterality Date  ? ELBOW SURGERY Left   ? "shattered elbow as a child"  ? ? ?Family History  ?Problem Relation Age of Onset  ? Hyperlipidemia Mother   ? Hypertension Mother   ? Drug abuse Sister   ?     died from drug overdose, 1/2 sister  ? Arthritis Maternal Grandmother   ? Schizophrenia Maternal Aunt   ? Colon cancer Neg Hx   ? Colon polyps Neg Hx   ? Esophageal cancer Neg Hx   ? Rectal cancer Neg Hx   ? Stomach cancer Neg Hx   ? ? ?Social History  ? ?Socioeconomic History  ? Marital status: Married  ?  Spouse name: Not on file  ? Number of children: Not on file  ? Years of education: Not on file  ? Highest education level: Not on file  ?  Occupational History  ? Not on file  ?Tobacco Use  ? Smoking status: Never  ?  Passive exposure: Yes  ? Smokeless tobacco: Never  ?Vaping Use  ? Vaping Use: Never used  ?Substance and Sexual Activity  ? Alcohol use: Yes  ?  Comment: rarely  ? Drug use: No  ? Sexual activity: Yes  ?  Partners: Female  ?Other Topics Concern  ? Not on file  ?Social History Narrative  ? 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    ? Single, no children  ? Married  ? Has a cat  ? Enjoys painting,writing, reading  ? Bachelors degree  ? ?Social Determinants of Health  ? ?Financial Resource Strain: Not on file  ?Food Insecurity: Not on file  ?Transportation Needs: Not on file  ?Physical Activity: Not on file  ?Stress: Not on file  ?Social  Connections: Not on file  ?Intimate Partner Violence: Not on file  ? ? ?Outpatient Medications Prior to Visit  ?Medication Sig Dispense Refill  ? ALPRAZolam (XANAX) 0.5 MG tablet Take 1 tablet (0.5 mg total) by mouth 2 (two) times daily as needed for anxiety. 30 tablet 0  ? amLODipine (NORVASC) 10 MG tablet TAKE 1 TABLET(10 MG) BY MOUTH DAILY 90 tablet 1  ? aspirin EC 81 MG tablet Take 81 mg by mouth daily. Does not take consistently.    ? benzoyl peroxide-erythromycin (BENZAMYCIN) gel Apply topically 2 (two) times daily. 23.3 g 5  ? busPIRone (BUSPAR) 15 MG tablet TAKE 1 TABLET(15 MG) BY MOUTH TWICE DAILY 180 tablet 1  ? lisinopril (ZESTRIL) 10 MG tablet TAKE 1/2 TABLET(5 MG) BY MOUTH DAILY 45 tablet 1  ? VITAMIN D PO Take by mouth.    ? ?No facility-administered medications prior to visit.  ? ? ?No Known Allergies ? ?Review of Systems  ?Constitutional:  Negative for fever.  ?     (-)unexpected weight change ?(-)Adenopathy  ?HENT:  Negative for congestion, hearing loss, sinus pain and sore throat.   ?     (-)Rhinorrhea   ?Eyes:   ?     (-)Visual disturbance  ?Respiratory:  Negative for cough, shortness of breath and wheezing.   ?Cardiovascular:  Negative for chest pain, palpitations and leg swelling.  ?Gastrointestinal:  Negative for blood in stool, constipation, diarrhea, nausea and vomiting.  ?Genitourinary:  Negative for dysuria, frequency and hematuria.  ?Musculoskeletal:  Negative for joint pain and myalgias.  ?Skin:   ?     (-)new moles  ?Neurological:  Negative for headaches.  ?Psychiatric/Behavioral:  Negative for depression. The patient is not nervous/anxious.   ? ?   ?Objective:  ?  ?Physical Exam ?Constitutional:   ?   General: He is not in acute distress. ?   Appearance: Normal appearance. He is not ill-appearing.  ?HENT:  ?   Head: Normocephalic and atraumatic.  ?   Right Ear: Tympanic membrane, ear canal and external ear normal.  ?   Left Ear: Tympanic membrane, ear canal and external ear normal.   ?Eyes:  ?   Extraocular Movements: Extraocular movements intact.  ?   Right eye: No nystagmus.  ?   Left eye: No nystagmus.  ?   Pupils: Pupils are equal, round, and reactive to light.  ?Cardiovascular:  ?   Rate and Rhythm: Normal rate and regular rhythm.  ?   Heart sounds: Normal heart sounds. No murmur heard. ?  No gallop.  ?Pulmonary:  ?   Effort: Pulmonary effort is normal.  No respiratory distress.  ?   Breath sounds: Normal breath sounds. No wheezing or rales.  ?Abdominal:  ?   General: Bowel sounds are normal. There is no distension.  ?   Palpations: Abdomen is soft.  ?   Tenderness: There is no abdominal tenderness. There is no guarding.  ?Musculoskeletal:  ?   Comments: 5/5 strength in both upper and lower extremities  ?Skin: ?   General: Skin is warm and dry.  ?Neurological:  ?   Mental Status: He is alert and oriented to person, place, and time.  ?   Deep Tendon Reflexes:  ?   Reflex Scores: ?     Patellar reflexes are 2+ on the right side and 2+ on the left side. ?Psychiatric:     ?   Judgment: Judgment normal.  ? ? ?BP 120/64   Pulse 60   Resp 18   Ht _0  (1.803 m)   Wt 269 lb (122 kg)   SpO2 98%   BMI 37.52 kg/m?  ?Wt Readings from Last 3 Encounters:  ?09/24/21 269 lb (122 kg)  ?06/20/21 260 lb (117.9 kg)  ?03/19/21 273 lb (123.8 kg)  ? ? ?   ?Assessment & Plan:  ? ?Problem List Items Addressed This Visit   ? ?  ? Unprioritized  ? Preventative health care  ?  Wt Readings from Last 3 Encounters:  ?09/24/21 269 lb (122 kg)  ?06/20/21 260 lb (117.9 kg)  ?03/19/21 273 lb (123.8 kg)  ?Discussed healthy diet, exercise, weight loss efforts.  [place]o up to date.  ?  ?  ? HTN (hypertension) - Primary  ? Relevant Orders  ? Comp Met (CMET)  ? ? ? ?No orders of the defined types were placed in this encounter. ? ? ?I, Nance Pear, NP, personally preformed the services described in this documentation.  All medical record entries made by the scribe were at my direction and in my presence.  I have  reviewed the chart and discharge instructions (if applicable) and agree that the record reflects my personal performance and is accurate and complete. 09/24/2021 ? ? 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

## 2021-09-24 NOTE — Assessment & Plan Note (Addendum)
Wt Readings from Last 3 Encounters:  ?09/24/21 269 lb (122 kg)  ?06/20/21 260 lb (117.9 kg)  ?03/19/21 273 lb (123.8 kg)  ? ?Discussed healthy diet, exercise, weight loss efforts.  Colo up to date.  ?

## 2021-09-24 NOTE — Patient Instructions (Signed)
Please complete lab work prior to leaving.  Continue your work on healthy diet, exercise and weight loss.  

## 2021-09-26 ENCOUNTER — Other Ambulatory Visit: Payer: Self-pay | Admitting: Family

## 2021-09-26 MED ORDER — ALPRAZOLAM 0.5 MG PO TABS: 0.5000 mg | ORAL_TABLET | Freq: Two times a day (BID) | ORAL | 0 refills | Status: DC | PRN

## 2021-09-26 NOTE — Telephone Encounter (Signed)
Requesting: alprazolam ?Contract:  ?UDS: 06/19/21 ?Last Visit: 09/24/21 ?Next Visit: 03/27/22 ?Last Refill: 08/29/21 ? ?Please Advise ? ?

## 2021-10-27 ENCOUNTER — Other Ambulatory Visit: Payer: Self-pay | Admitting: Family

## 2021-10-27 MED ORDER — ALPRAZOLAM 0.5 MG PO TABS: 0.5000 mg | ORAL_TABLET | Freq: Two times a day (BID) | ORAL | 0 refills | Status: DC | PRN

## 2021-10-27 NOTE — Telephone Encounter (Signed)
Requesting:  alprazolam ?Contract: 06/19/19 ?UDS: 06/19/21 ?Last Visit: 09/24/21 ?Next Visit: 03/27/22 ?Last Refill: 09/26/21 ? ?Please Advise ? ?

## 2021-11-27 ENCOUNTER — Other Ambulatory Visit: Payer: Self-pay | Admitting: Family

## 2021-11-28 MED ORDER — ALPRAZOLAM 0.5 MG PO TABS: 0.5000 mg | ORAL_TABLET | Freq: Two times a day (BID) | ORAL | 0 refills | Status: DC | PRN

## 2021-11-28 NOTE — Telephone Encounter (Signed)
Requesting: alprazolam 0.'5mg'$   Contract: 05/29/2019 UDS: 06/20/21 Last Visit: 09/24/21 Next Visit: 03/27/22 Last Refill: 10/27/21 #30 and 0RF  Please Advise

## 2021-12-26 ENCOUNTER — Other Ambulatory Visit: Payer: Self-pay | Admitting: Family

## 2021-12-26 NOTE — Telephone Encounter (Signed)
Requesting: Xanax Contract: 12/14/202 UDS: 06/20/21 Last Visit: 09/24/21 Next Visit: 03/27/22 Last Refill: 11/28/21  Please Advise

## 2021-12-28 MED ORDER — ALPRAZOLAM 0.5 MG PO TABS: 0.5000 mg | ORAL_TABLET | Freq: Two times a day (BID) | ORAL | 0 refills | Status: DC | PRN

## 2022-01-05 ENCOUNTER — Other Ambulatory Visit: Payer: Self-pay | Admitting: Family

## 2022-01-29 ENCOUNTER — Other Ambulatory Visit: Payer: Self-pay | Admitting: Family

## 2022-01-29 MED ORDER — ALPRAZOLAM 0.5 MG PO TABS: 0.5000 mg | ORAL_TABLET | Freq: Two times a day (BID) | ORAL | 0 refills | Status: DC | PRN

## 2022-01-29 NOTE — Telephone Encounter (Signed)
Requesting:xanax 0.5 mg Contract:05/29/19 UDS:06/20/21 Last Visit:09/24/21 Next Visit:03/27/22 Last Refill:12/28/21  Please Advise

## 2022-02-11 ENCOUNTER — Other Ambulatory Visit: Payer: Self-pay | Admitting: Family

## 2022-03-03 ENCOUNTER — Other Ambulatory Visit: Payer: Self-pay | Admitting: Family Medicine

## 2022-03-04 MED ORDER — ALPRAZOLAM 0.5 MG PO TABS: 0.5000 mg | ORAL_TABLET | Freq: Two times a day (BID) | ORAL | 0 refills | Status: DC | PRN

## 2022-03-04 NOTE — Telephone Encounter (Signed)
Requesting: alprazolam 0.'5mg'$   Contract:05/29/2019 UDS:06/20/21 Last Visit: 09/24/21 Next Visit: 03/27/22 Last Refill: 01/29/22 #30 and 0RF  Please Advise

## 2022-03-11 ENCOUNTER — Encounter: Payer: Self-pay | Admitting: Family

## 2022-03-11 MED ORDER — AMLODIPINE BESYLATE 10 MG PO TABS: 10.0000 mg | ORAL_TABLET | Freq: Every day | ORAL | 0 refills | Status: DC

## 2022-03-23 ENCOUNTER — Encounter: Payer: Self-pay | Admitting: Family

## 2022-03-27 ENCOUNTER — Ambulatory Visit: Payer: BC Managed Care – PPO | Admitting: Family

## 2022-03-27 VITALS — BP 120/70 | HR 55 | Resp 18 | Ht 71.0 in | Wt 275.6 lb

## 2022-03-27 DIAGNOSIS — I10 Essential (primary) hypertension: Secondary | ICD-10-CM

## 2022-03-27 DIAGNOSIS — F419 Anxiety disorder, unspecified: Secondary | ICD-10-CM | POA: Diagnosis not present

## 2022-03-27 DIAGNOSIS — E782 Mixed hyperlipidemia: Secondary | ICD-10-CM | POA: Diagnosis not present

## 2022-03-27 DIAGNOSIS — Z23 Encounter for immunization: Secondary | ICD-10-CM

## 2022-03-27 LAB — LIPID PANEL
Cholesterol: 173 mg/dL (ref 0–200)
HDL: 58.5 mg/dL (ref >39.00)
LDL Cholesterol: 102 mg/dL — ABNORMAL HIGH (ref 0–99)
NonHDL: 114.36
Total CHOL/HDL Ratio: 3
Triglycerides: 64 mg/dL (ref 0.0–149.0)
VLDL: 12.8 mg/dL (ref 0.0–40.0)

## 2022-03-27 LAB — BASIC METABOLIC PANEL
BUN: 9 mg/dL (ref 6–23)
CO2: 26 mEq/L (ref 19–32)
Calcium: 9.4 mg/dL (ref 8.4–10.5)
Chloride: 104 mEq/L (ref 96–112)
Creatinine, Ser: 1.04 mg/dL (ref 0.40–1.50)
GFR: 83.84 mL/min (ref >60.00)
Glucose, Bld: 87 mg/dL (ref 70–99)
Potassium: 4.2 mEq/L (ref 3.5–5.1)
Sodium: 137 mEq/L (ref 135–145)

## 2022-03-27 NOTE — Assessment & Plan Note (Addendum)
Lab Results  Component Value Date   CHOL 184 09/17/2020   HDL 61.00 09/17/2020   LDLCALC 111 (H) 09/17/2020   TRIG 62.0 09/17/2020   CHOLHDL 3 09/17/2020   Repeat lipid panel.

## 2022-03-27 NOTE — Progress Notes (Signed)
Subjective:     Patient ID: Chad Mejia, male    DOB: [birth year], 51 y.o.   MRN: [id number]  Chief Complaint  Patient presents with   Follow-up    6 month     HPI Patient is in today for follow up.  HTN- maintained on lisinopril '10mg'$  and amlodipine '10mg'$ .   BP Readings from Last 3 Encounters:  03/27/22 120/70  09/24/21 120/64  06/20/21 123/74   Anxiety- maintained on buspar '15mg'$  twice daily. Uses xanax prn. Reports that he typically uses one a day during the AM during the work week. He had to put her cat to sleep 2 weeks ago. He is also in the process of moving. Has been stress eating and has gained weight.  Wt Readings from Last 3 Encounters:  03/27/22 275 lb 9.6 oz (125 kg)  09/24/21 269 lb (122 kg)  06/20/21 260 lb (117.9 kg)     Health Maintenance Due  Topic Date Due   COVID-19 Vaccine (5 - Pfizer series) 04/19/2021   Zoster Vaccines- Shingrix (1 of 2) Never done   INFLUENZA VACCINE  01/13/2022    Past Medical History:  Diagnosis Date   Anxiety    Depression    GERD (gastroesophageal reflux disease)    History of migraine    Hyperlipidemia    Hypertension     Past Surgical History:  Procedure Laterality Date   ELBOW SURGERY Left    "shattered elbow as a child"    Family History  Problem Relation Age of Onset   Hyperlipidemia Mother    Hypertension Mother    Drug abuse Sister        died from drug overdose, 1/2 sister   Arthritis Maternal Grandmother    Schizophrenia Maternal Aunt    Colon cancer Neg Hx    Colon polyps Neg Hx    Esophageal cancer Neg Hx    Rectal cancer Neg Hx    Stomach cancer Neg Hx     Social History   Socioeconomic History   Marital status: Married    Spouse name: Not on file   Number of children: Not on file   Years of education: Not on file   Highest education level: Not on file  Occupational History   Not on file  Tobacco Use   Smoking status: Never    Passive exposure: Yes   Smokeless tobacco: Never   Vaping Use   Vaping Use: Never used  Substance and Sexual Activity   Alcohol use: Yes    Comment: rarely   Drug use: No   Sexual activity: Yes    Partners: Female  Other Topics Concern   Not on file  Social History Narrative   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     Single, no children   Married   Has a cat   Enjoys Photographer, reading   Buyer, retail degree   Social Determinants of Health   Financial Resource Strain: Not on file  Food Insecurity: Not on file  Transportation Needs: Not on file  Physical Activity: Not on file  Stress: Not on file  Social Connections: Not on file  Intimate Partner Violence: Not on file    Outpatient Medications Prior to Visit  Medication Sig Dispense Refill   ALPRAZolam (XANAX) 0.5 MG tablet Take 1 tablet (0.5 mg total) by mouth 2 (two) times daily as needed for anxiety. 30 tablet 0   amLODipine (NORVASC) 10 MG tablet Take 1 tablet (10 mg total)  by mouth daily. 90 tablet 0   aspirin EC 81 MG tablet Take 81 mg by mouth daily. Does not take consistently.     benzoyl peroxide-erythromycin (BENZAMYCIN) gel Apply topically 2 (two) times daily. 23.3 g 5   busPIRone (BUSPAR) 15 MG tablet TAKE 1 TABLET(15 MG) BY MOUTH TWICE DAILY 180 tablet 1   lisinopril (ZESTRIL) 10 MG tablet Take 0.5 tablets (5 mg total) by mouth daily. 45 tablet 0   VITAMIN D PO Take by mouth.     No facility-administered medications prior to visit.    No Known Allergies  ROS See HPI    Objective:    Physical Exam Constitutional:      General: He is not in acute distress.    Appearance: He is well-developed.  HENT:     Head: Normocephalic and atraumatic.  Cardiovascular:     Rate and Rhythm: Normal rate and regular rhythm.     Heart sounds: No murmur heard. Pulmonary:     Effort: Pulmonary effort is normal. No respiratory distress.     Breath sounds: Normal breath sounds. No wheezing or rales.  Skin:    General: Skin is warm and dry.   Neurological:     Mental Status: He is alert and oriented to person, place, and time.  Psychiatric:        Behavior: Behavior normal.        Thought Content: Thought content normal.     BP 120/70   Pulse (!) 55   Resp 18   Ht '5\' 11"'$  (1.803 m)   Wt 275 lb 9.6 oz (125 kg)   SpO2 100%   BMI 38.44 kg/m  Wt Readings from Last 3 Encounters:  03/27/22 275 lb 9.6 oz (125 kg)  09/24/21 269 lb (122 kg)  06/20/21 260 lb (117.9 kg)       Assessment & Plan:   Problem List Items Addressed This Visit       Unprioritized   Hypertension - Primary    bp at goal. Continue lisinopril and amlodipine. Repeat bmet.       Relevant Orders   Basic metabolic panel   Hyperlipidemia    Lab Results  Component Value Date   CHOL 184 09/17/2020   HDL 61.00 09/17/2020   LDLCALC 111 (H) 09/17/2020   TRIG 62.0 09/17/2020   CHOLHDL 3 09/17/2020  Repeat lipid panel.       Relevant Orders   Lipid panel   Anxiety    Fair control- worsened by recent stressors. Continue buspar and prn xanax. Controlled substance contract is updated.        I am having Chad Nordmann. Mejia maintain his benzoyl peroxide-erythromycin, VITAMIN D PO, aspirin EC, busPIRone, lisinopril, ALPRAZolam, and amLODipine.  No orders of the defined types were placed in this encounter.

## 2022-03-27 NOTE — Assessment & Plan Note (Signed)
>>  ASSESSMENT AND PLAN FOR HYPERTENSION WRITTEN ON 03/27/2022  7:18 AM BY O'SULLIVAN, Oluwatimileyin Vivier, NP  bp at goal. Continue lisinopril and amlodipine. Repeat bmet.

## 2022-03-27 NOTE — Assessment & Plan Note (Signed)
Fair control- worsened by recent stressors. Continue buspar and prn xanax. Controlled substance contract is updated.

## 2022-03-27 NOTE — Assessment & Plan Note (Signed)
bp at goal. Continue lisinopril and amlodipine. Repeat bmet.

## 2022-04-01 ENCOUNTER — Other Ambulatory Visit: Payer: Self-pay | Admitting: Family

## 2022-04-01 MED ORDER — ALPRAZOLAM 0.5 MG PO TABS: 0.5000 mg | ORAL_TABLET | Freq: Two times a day (BID) | ORAL | 0 refills | Status: DC | PRN

## 2022-04-01 NOTE — Telephone Encounter (Signed)
Requesting: alprazolam 0.'5mg'$  Contract: 03/27/22 UDS: 06/20/21 Last Visit: 03/27/22 Next Visit: 10/02/22  Last Refill: 03/04/22  Please Advise

## 2022-04-30 ENCOUNTER — Other Ambulatory Visit: Payer: Self-pay | Admitting: Family

## 2022-05-01 MED ORDER — ALPRAZOLAM 0.5 MG PO TABS: 0.5000 mg | ORAL_TABLET | Freq: Two times a day (BID) | ORAL | 0 refills | Status: DC | PRN

## 2022-05-01 NOTE — Telephone Encounter (Signed)
Requesting: alprazolam Contract: 03/27/22 UDS: 06/20/21 Last Visit: 03/27/22 Next Visit: 10/02/22 Last Refill: 04/01/22  Please Advise

## 2022-05-12 ENCOUNTER — Other Ambulatory Visit: Payer: Self-pay | Admitting: Family

## 2022-05-31 ENCOUNTER — Other Ambulatory Visit: Payer: Self-pay | Admitting: Family

## 2022-06-01 ENCOUNTER — Other Ambulatory Visit: Payer: Self-pay | Admitting: Family

## 2022-06-01 NOTE — Telephone Encounter (Signed)
Requesting: alprazolam 0.'5mg'$   Contract: 05/29/2019 UDS: 06/20/21 Last Visit: 03/27/22 Next Visit:10/02/22 Last Refill: 05/01/22 #30 and 0RF   Please Advise

## 2022-06-02 MED ORDER — ALPRAZOLAM 0.5 MG PO TABS: 0.5000 mg | ORAL_TABLET | Freq: Two times a day (BID) | ORAL | 0 refills | Status: DC | PRN

## 2022-07-07 ENCOUNTER — Other Ambulatory Visit: Payer: Self-pay | Admitting: Family

## 2022-07-07 NOTE — Telephone Encounter (Signed)
Requesting: alprazolam 0.'5mg'$   Contract:05/29/2019 UDS: 06/20/21 Last Visit: 03/27/22 Next Visit: 10/02/22 Last Refill: 06/02/22 #30 and 0RF   Please Advise

## 2022-07-08 MED ORDER — ALPRAZOLAM 0.5 MG PO TABS: 0.5000 mg | ORAL_TABLET | Freq: Two times a day (BID) | ORAL | 0 refills | Status: DC | PRN

## 2022-08-10 ENCOUNTER — Other Ambulatory Visit: Payer: Self-pay | Admitting: Family

## 2022-08-12 ENCOUNTER — Other Ambulatory Visit: Payer: Self-pay

## 2022-08-12 ENCOUNTER — Encounter: Payer: Self-pay | Admitting: Family

## 2022-08-12 MED ORDER — BUSPIRONE HCL 15 MG PO TABS: ORAL_TABLET | ORAL | 1 refills | Status: DC

## 2022-08-18 ENCOUNTER — Other Ambulatory Visit: Payer: Self-pay | Admitting: Family

## 2022-08-18 MED ORDER — ALPRAZOLAM 0.5 MG PO TABS: 0.5000 mg | ORAL_TABLET | Freq: Two times a day (BID) | ORAL | 0 refills | Status: DC | PRN

## 2022-08-18 NOTE — Telephone Encounter (Signed)
Requesting: xanax Contract:06/08/2019 UDS:06/20/2021 Last Visit:03/27/2022 Next Visit:10/02/2022 Last Refill:07/08/2022  Please Advise

## 2022-08-29 ENCOUNTER — Other Ambulatory Visit: Payer: Self-pay | Admitting: Family

## 2022-09-23 ENCOUNTER — Other Ambulatory Visit: Payer: Self-pay | Admitting: Family

## 2022-09-23 MED ORDER — ALPRAZOLAM 0.5 MG PO TABS: 0.5000 mg | ORAL_TABLET | Freq: Two times a day (BID) | ORAL | 0 refills | Status: DC | PRN

## 2022-09-23 NOTE — Telephone Encounter (Signed)
Requesting: Xanax Contract: 05/29/2019 UDS: 06/20/2021 Last Visit: 03/27/2022 Next Visit: 10/02/2022 Last Refill: 08/18/2022  Please Advise

## 2022-10-02 ENCOUNTER — Ambulatory Visit: Payer: BC Managed Care – PPO | Admitting: Family

## 2022-10-02 VITALS — BP 100/60 | HR 66 | Temp 98.2°F | Resp 18 | Ht 71.0 in | Wt 278.0 lb

## 2022-10-02 DIAGNOSIS — R252 Cramp and spasm: Secondary | ICD-10-CM | POA: Diagnosis not present

## 2022-10-02 DIAGNOSIS — F419 Anxiety disorder, unspecified: Secondary | ICD-10-CM

## 2022-10-02 DIAGNOSIS — Z23 Encounter for immunization: Secondary | ICD-10-CM | POA: Diagnosis not present

## 2022-10-02 DIAGNOSIS — I10 Essential (primary) hypertension: Secondary | ICD-10-CM | POA: Diagnosis not present

## 2022-10-02 DIAGNOSIS — F321 Major depressive disorder, single episode, moderate: Secondary | ICD-10-CM

## 2022-10-02 LAB — BASIC METABOLIC PANEL
BUN: 11 mg/dL (ref 6–23)
CO2: 29 mEq/L (ref 19–32)
Calcium: 9.4 mg/dL (ref 8.4–10.5)
Chloride: 105 mEq/L (ref 96–112)
Creatinine, Ser: 1.16 mg/dL (ref 0.40–1.50)
GFR: 73.28 mL/min (ref >60.00)
Glucose, Bld: 92 mg/dL (ref 70–99)
Potassium: 3.9 mEq/L (ref 3.5–5.1)
Sodium: 138 mEq/L (ref 135–145)

## 2022-10-02 LAB — MAGNESIUM: Magnesium: 2 mg/dL (ref 1.5–2.5)

## 2022-10-02 MED ORDER — BUSPIRONE HCL 15 MG PO TABS: ORAL_TABLET | ORAL | 1 refills | Status: DC

## 2022-10-02 MED ORDER — AMLODIPINE BESYLATE 10 MG PO TABS: ORAL_TABLET | ORAL | 1 refills | Status: DC

## 2022-10-02 NOTE — Assessment & Plan Note (Signed)
Depression appears stable.

## 2022-10-02 NOTE — Assessment & Plan Note (Signed)
>>  ASSESSMENT AND PLAN FOR HYPERTENSION WRITTEN ON 10/02/2022  7:23 AM BY O'SULLIVAN, Kassidee Narciso, NP  BP appears overtreated.  Recommended that he discontinue lisinopril 5mg .  Repeat bp once daily for a few days off of lisinopril and send me updated readings.  Continue amlodipine 10mg .

## 2022-10-02 NOTE — Patient Instructions (Signed)
Stop lisinopril.  Send me some updated blood pressure readings in a few days off of the lisinopril.

## 2022-10-02 NOTE — Progress Notes (Signed)
Subjective:     Patient ID: Chad Mejia, male    DOB: 1971/01/22, 52 y.o.   MRN: [id number]  Chief Complaint  Patient presents with   Follow-up    HPI Patient is in today for follow up.  HTN- maintained on lisinopril 5 mg and amlodipine .   BP Readings from Last 3 Encounters:  10/02/22 100/60  03/27/22 120/70  09/24/21 120/64   Hyperlipidemia- not on statin Lab Results  Component Value Date   CHOL 173 03/27/2022   HDL 58.50 03/27/2022   LDLCALC 102 (H) 03/27/2022   TRIG 64.0 03/27/2022   CHOLHDL 3 03/27/2022   Morbid obesity- he and his wife are planning to add some additional walking in the evenings.  Wt Readings from Last 3 Encounters:  10/02/22 278 lb (126.1 kg)  03/27/22 275 lb 9.6 oz (125 kg)  09/24/21 269 lb (122 kg)    Anxiety- maintained on xanax and prn alprazolam. Takes every AM on weekdays and occasional second dose at work. He notes increased stress recently- wife had an unexpected pregnancy but miscarried.  Cat diet recently.  They moved which was stressful.  He and his wife have both decided to begin seeing counselors individually and together.  Notes some cramping in his toes- especially at night.  He is on his feet a lot at work. Sometimes he gets cramping on his hands, but notes that he works on a computer a lot during the day.   Health Maintenance Due  Topic Date Due   Zoster Vaccines- Shingrix (2 of 2) 05/22/2022    Past Medical History:  Diagnosis Date   Anxiety    Depression    GERD (gastroesophageal reflux disease)    History of migraine    Hyperlipidemia    Hypertension     Past Surgical History:  Procedure Laterality Date   ELBOW SURGERY Left    "shattered elbow as a child"    Family History  Problem Relation Age of Onset   Hyperlipidemia Mother    Hypertension Mother    Drug abuse Sister        died from drug overdose, 1/2 sister   Arthritis Maternal Grandmother    Schizophrenia Maternal Aunt    Colon cancer Neg  Hx    Colon polyps Neg Hx    Esophageal cancer Neg Hx    Rectal cancer Neg Hx    Stomach cancer Neg Hx     Social History   Socioeconomic History   Marital status: Married    Spouse name: Not on file   Number of children: Not on file   Years of education: Not on file   Highest education level: Bachelor's degree (e.g., BA, AB, BS)  Occupational History   Not on file  Tobacco Use   Smoking status: Never    Passive exposure: Yes   Smokeless tobacco: Never  Vaping Use   Vaping Use: Never used  Substance and Sexual Activity   Alcohol use: Yes    Comment: rarely   Drug use: No   Sexual activity: Yes    Partners: Female  Other Topics Concern   Not on file  Social History Narrative   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     Single, no children   Married   Has a cat   Enjoys painting,writing, reading   Chief Operating Officer degree   Social Determinants of Health   Financial Resource Strain: Patient Declined (09/26/2022)   Overall Financial Resource Strain (CARDIA)  Difficulty of Paying Living Expenses: Patient declined  Food Insecurity: Patient Declined (09/26/2022)   Hunger Vital Sign    Worried About Running Out of Food in the Last Year: Patient declined    Ran Out of Food in the Last Year: Patient declined  Transportation Needs: No Transportation Needs (09/26/2022)   PRAPARE - Administrator, Civil Service (Medical): No    Lack of Transportation (Non-Medical): No  Physical Activity: Insufficiently Active (09/26/2022)   Exercise Vital Sign    Days of Exercise per Week: 5 days    Minutes of Exercise per Session: 20 min  Stress: Stress Concern Present (09/26/2022)   [employer] of Occupational Health - Occupational Stress Questionnaire    Feeling of Stress : Very much  Social Connections: Unknown (09/26/2022)   Social Connection and Isolation Panel [NHANES]    Frequency of Communication with Friends and Family: Patient declined    Frequency of Social  Gatherings with Friends and Family: Patient declined    Attends Religious Services: Patient declined    Database administrator or Organizations: Patient declined    Attends Engineer, structural: Not on file    Marital Status: Married  Catering manager Violence: Not on file    Outpatient Medications Prior to Visit  Medication Sig Dispense Refill   ALPRAZolam (XANAX) 0.5 MG tablet Take 1 tablet (0.5 mg total) by mouth 2 (two) times daily as needed for anxiety. 30 tablet 0   aspirin EC 81 MG tablet Take 81 mg by mouth daily. Does not take consistently.     benzoyl peroxide-erythromycin (BENZAMYCIN) gel Apply topically 2 (two) times daily. 23.3 g 5   VITAMIN D PO Take by mouth.     amLODipine (NORVASC) 10 MG tablet TAKE 1 TABLET(10 MG) BY MOUTH DAILY 90 tablet 0   busPIRone (BUSPAR) 15 MG tablet TAKE 1 TABLET(15 MG) BY MOUTH TWICE DAILY 180 tablet 1   lisinopril (ZESTRIL) 10 MG tablet TAKE 1/2 TABLET(5 MG) BY MOUTH DAILY 45 tablet 0   No facility-administered medications prior to visit.    No Known Allergies  ROS  See HPI     Objective:    Physical Exam Constitutional:      General: He is not in acute distress.    Appearance: He is well-developed.  HENT:     Head: Normocephalic and atraumatic.  Cardiovascular:     Rate and Rhythm: Normal rate and regular rhythm.     Heart sounds: No murmur heard. Pulmonary:     Effort: Pulmonary effort is normal. No respiratory distress.     Breath sounds: Normal breath sounds. No wheezing or rales.  Skin:    General: Skin is warm and dry.  Neurological:     Mental Status: He is alert and oriented to person, place, and time.  Psychiatric:        Behavior: Behavior normal.        Thought Content: Thought content normal.     BP 100/60   Pulse 66   Temp 98.2 F (36.8 C)   Resp 18   Ht  (1.803 m)   Wt 278 lb (126.1 kg)   SpO2 98%   BMI 38.77 kg/m  Wt Readings from Last 3 Encounters:  10/02/22 278 lb (126.1 kg)   03/27/22 275 lb 9.6 oz (125 kg)  09/24/21 269 lb (122 kg)       Assessment & Plan:   Problem List Items Addressed This Visit  Unprioritized   Muscle cramps    Check electrolytes. Recommended increased hydration and stretching before bedtime.       Relevant Orders   Magnesium   Morbid obesity    Increase walking to help with weight loss.       Major depression    Depression appears stable.       Relevant Medications   busPIRone (BUSPAR) 15 MG tablet   Hypertension    BP appears overtreated.  Recommended that he discontinue lisinopril 5mg .  Repeat bp once daily for a few days off of lisinopril and send me updated readings.  Continue amlodipine 10mg .       Relevant Medications   amLODipine (NORVASC) 10 MG tablet   Other Relevant Orders   Basic Metabolic Panel (BMET)   Anxiety - Primary    Fair control on buspar. I do think he would benefit from counseling. He has the contact information on our counseling services from a previous visit.  Continue xanax prn.  UDS today. Contract up to date.       Relevant Medications   busPIRone (BUSPAR) 15 MG tablet   Other Relevant Orders   DRUG MONITORING, PANEL 8 WITH CONFIRMATION, URINE   Shingrix #2 today.   I have discontinued Lucina Mellow. Aguila's lisinopril. I am also having him maintain his benzoyl peroxide-erythromycin, VITAMIN D PO, aspirin EC, ALPRAZolam, amLODipine, and busPIRone.  Meds ordered this encounter  Medications   amLODipine (NORVASC) 10 MG tablet    Sig: TAKE 1 TABLET(10 MG) BY MOUTH DAILY    Dispense:  90 tablet    Refill:  1    Order Specific Question:   Supervising Provider    Answer:   Danise Edge A [[id number]]   busPIRone (BUSPAR) 15 MG tablet    Sig: TAKE 1 TABLET(15 MG) BY MOUTH TWICE DAILY    Dispense:  180 tablet    Refill:  1    Order Specific Question:   Supervising Provider    Answer:   Danise Edge A [[id number]]

## 2022-10-02 NOTE — Assessment & Plan Note (Signed)
Check electrolytes. Recommended increased hydration and stretching before bedtime.

## 2022-10-02 NOTE — Assessment & Plan Note (Signed)
Increase walking to help with weight loss.

## 2022-10-02 NOTE — Assessment & Plan Note (Signed)
BP appears overtreated.  Recommended that he discontinue lisinopril .  Repeat bp once daily for a few days off of lisinopril and send me updated readings.  Continue amlodipine .

## 2022-10-02 NOTE — Assessment & Plan Note (Signed)
Fair control on buspar. I do think he would benefit from counseling. He has the contact information on our counseling services from a previous visit.  Continue xanax prn.  UDS today. Contract up to date.

## 2022-10-03 LAB — DM TEMPLATE

## 2022-10-05 LAB — DRUG MONITORING, PANEL 8 WITH CONFIRMATION, URINE
6 Acetylmorphine: NEGATIVE ng/mL (ref <10)
Alcohol Metabolites: NEGATIVE ng/mL (ref <500)
Alphahydroxyalprazolam: 113 ng/mL — ABNORMAL HIGH (ref <25)
Alphahydroxymidazolam: NEGATIVE ng/mL (ref <50)
Alphahydroxytriazolam: NEGATIVE ng/mL (ref <50)
Aminoclonazepam: NEGATIVE ng/mL (ref <25)
Amphetamines: NEGATIVE ng/mL (ref <500)
Benzodiazepines: POSITIVE ng/mL — AB (ref <100)
Buprenorphine, Urine: NEGATIVE ng/mL (ref <5)
Cocaine Metabolite: NEGATIVE ng/mL (ref <150)
Creatinine: 153.3 mg/dL (ref >=20.0)
Hydroxyethylflurazepam: NEGATIVE ng/mL (ref <50)
Lorazepam: NEGATIVE ng/mL (ref <50)
MDMA: NEGATIVE ng/mL (ref <500)
Marijuana Metabolite: NEGATIVE ng/mL (ref <20)
Nordiazepam: NEGATIVE ng/mL (ref <50)
Opiates: NEGATIVE ng/mL (ref <100)
Oxazepam: NEGATIVE ng/mL (ref <50)
Oxidant: NEGATIVE ug/mL (ref <200)
Oxycodone: NEGATIVE ng/mL (ref <100)
Temazepam: NEGATIVE ng/mL (ref <50)
pH: 5.3 (ref 4.5–9.0)

## 2022-10-05 LAB — DM TEMPLATE

## 2022-10-06 ENCOUNTER — Encounter: Payer: Self-pay | Admitting: Family

## 2022-10-20 IMAGING — CT CT RENAL STONE PROTOCOL
2 of 4 series · 17 of 46 positions shown, 19 images · non-contrast
Comparison: None.

CLINICAL DATA: Flank pain, kidney stone suspected

EXAM:
CT ABDOMEN AND PELVIS WITHOUT CONTRAST
TECHNIQUE: Multidetector CT imaging of the abdomen and pelvis was performed
following the standard protocol without IV contrast.

[Series 2: axial st · axial · 0.95mm/px · z∈[-385,+10]mm · 14 of 89 slices shown, 16 images]
[im 5/89  soft-tissue]
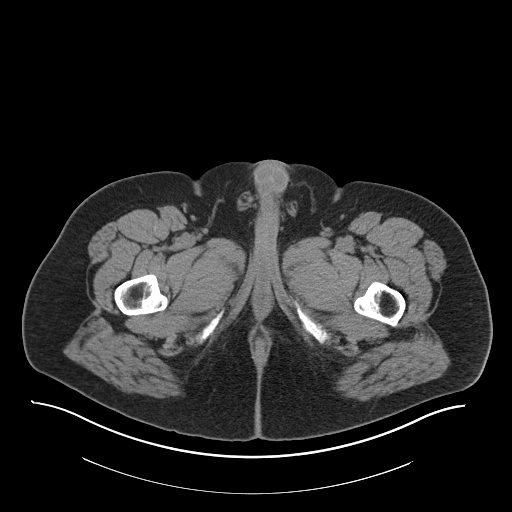
[im 5/89  bone]
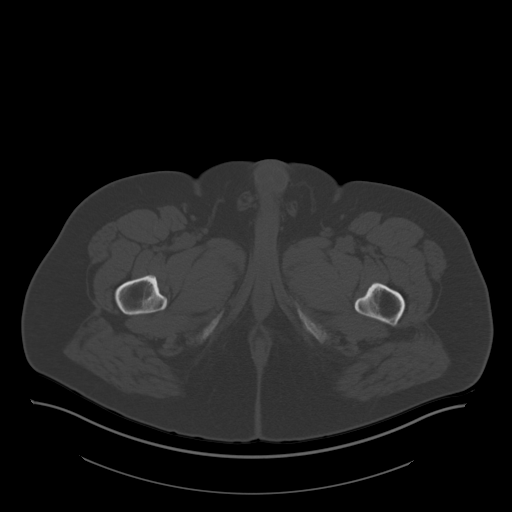
[im 10/89  soft-tissue]
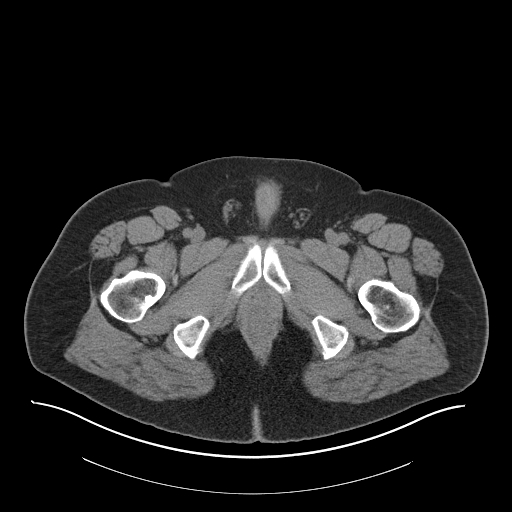
[im 19/89  soft-tissue]
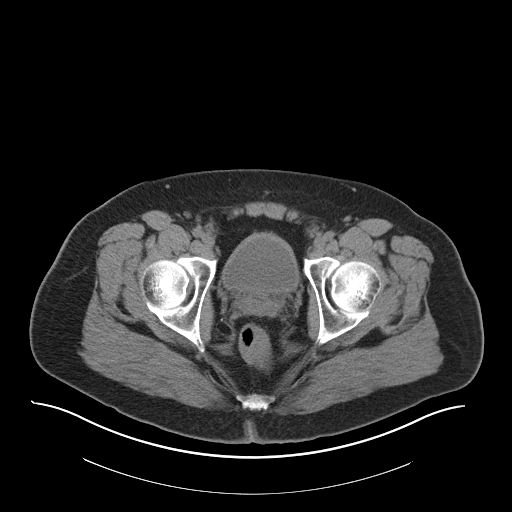
[im 24/89  soft-tissue]
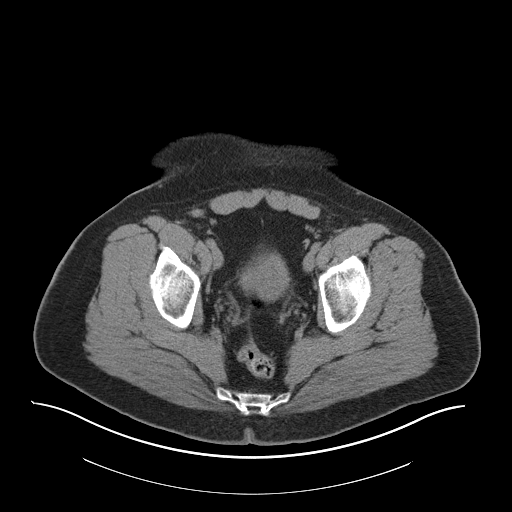
[im 28/89  soft-tissue]
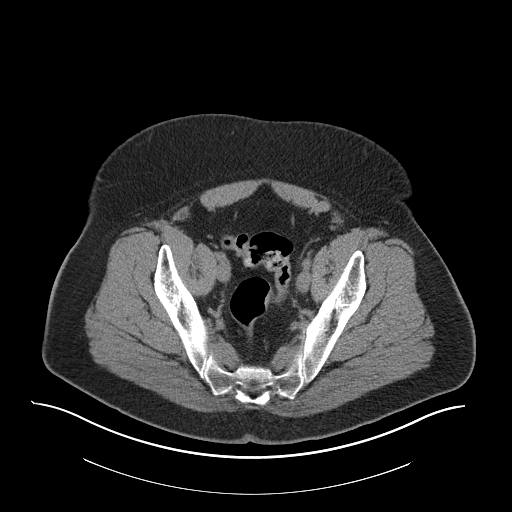
[im 38/89  soft-tissue]
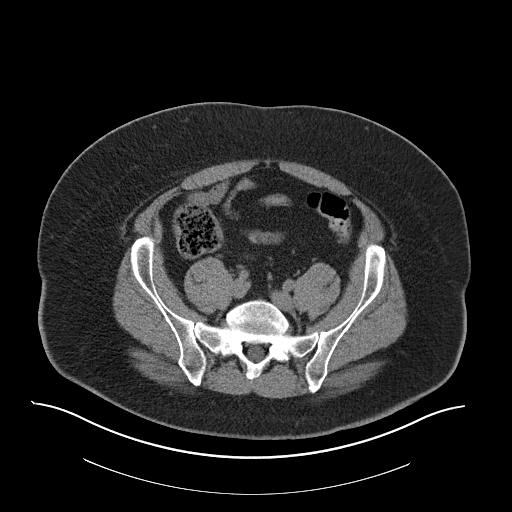
[im 42/89  soft-tissue]
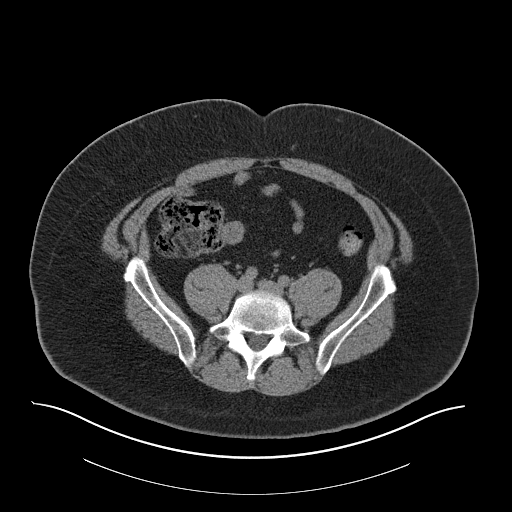
[im 47/89  soft-tissue]
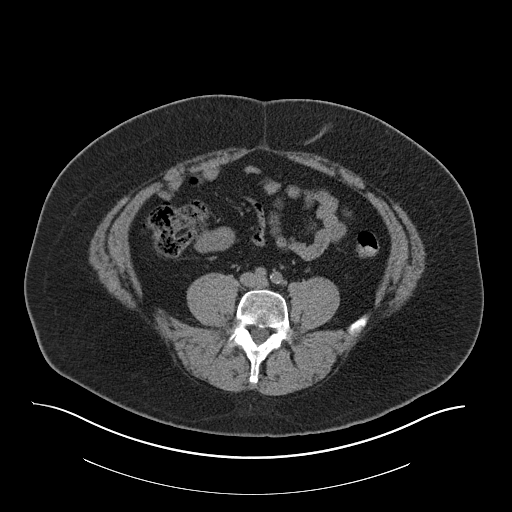
[im 51/89  soft-tissue]
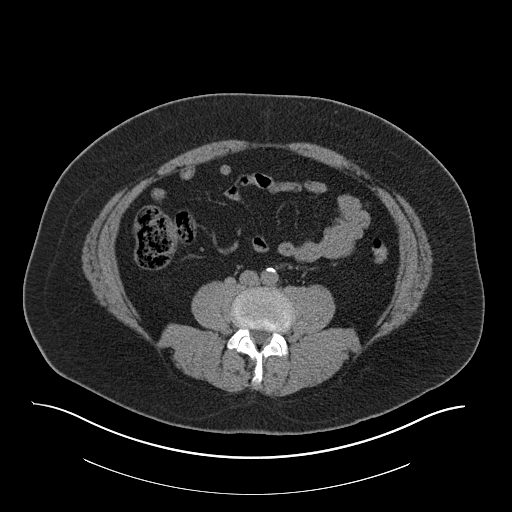
[im 51/89  bone]
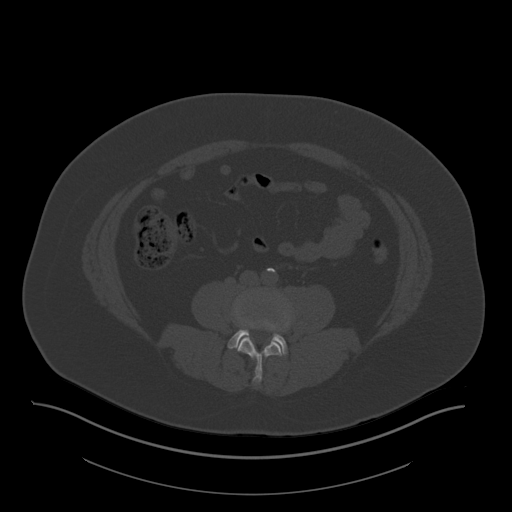
[im 61/89  soft-tissue]
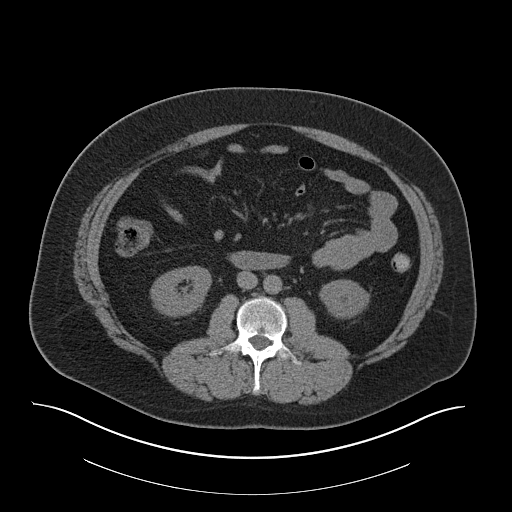
[im 65/89  soft-tissue]
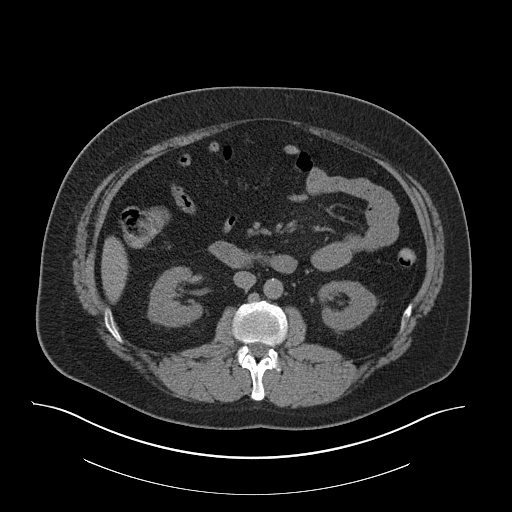
[im 70/89  soft-tissue]
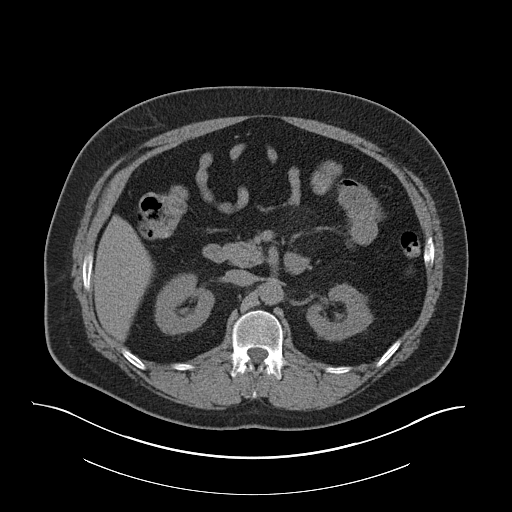
[im 79/89  soft-tissue]
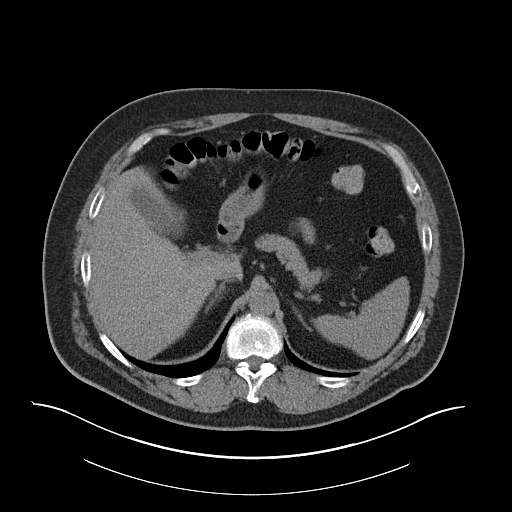
[im 84/89  soft-tissue]
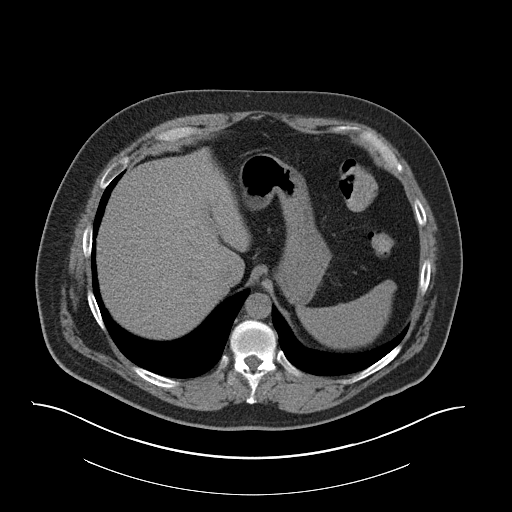

[Series 4: coronal · coronal · 0.87mm/px · 3 of 173 slices shown]
[im 58/173  soft-tissue]
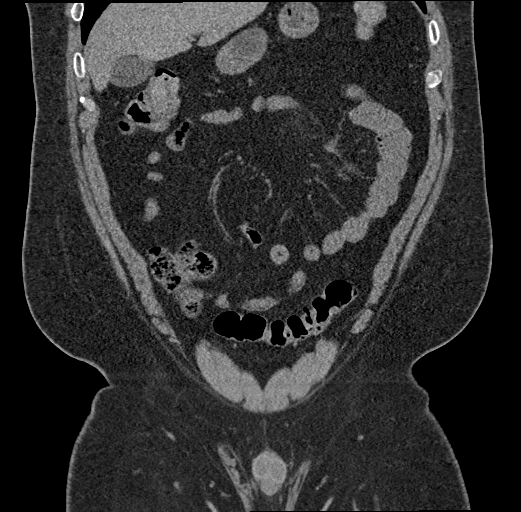
[im 77/173  soft-tissue]
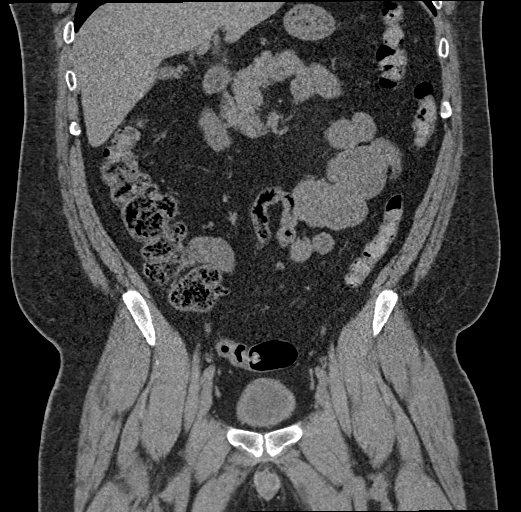
[im 96/173  soft-tissue]
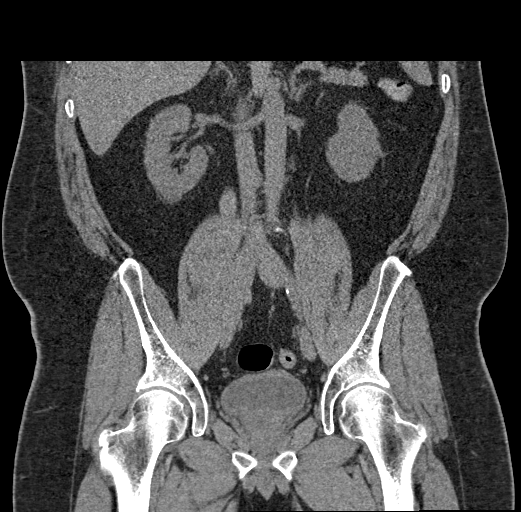

[17 of 46 positions shown; findings below may reference images not displayed]

FINDINGS: Lower chest: No acute abnormality.

Hepatobiliary: Dome of liver is only partially imaged. There is a
too small to characterize low-density lesion probably reflecting a
cyst. Gallbladder is unremarkable. No biliary dilatation.

Pancreas: Unremarkable.

Spleen: Unremarkable.

Adrenals/Urinary Tract: Adrenals are unremarkable. No renal calculi.
No hydronephrosis. Ureters are normal in caliber. Bladder is poorly
distended but unremarkable.

Stomach/Bowel: Stomach is within normal limits. Bowel is normal in
caliber. Normal appendix.

Vascular/Lymphatic: Minimal aortic atherosclerosis. No enlarged
nodes.

Reproductive: Unremarkable.

Other: No free fluid.  Abdominal wall is unremarkable.

Musculoskeletal: No significant abnormality.
IMPRESSION: No acute abnormality.  No urinary tract calculi.

## 2022-10-26 ENCOUNTER — Other Ambulatory Visit: Payer: Self-pay | Admitting: Family

## 2022-10-26 MED ORDER — ALPRAZOLAM 0.5 MG PO TABS: 0.5000 mg | ORAL_TABLET | Freq: Two times a day (BID) | ORAL | 0 refills | Status: DC | PRN

## 2022-10-26 NOTE — Telephone Encounter (Signed)
Requesting:alprazolam 0.5mg   Contract: 04/08/22 UDS: 10/02/22 Last Visit: 10/02/22 Next Visit: 03/30/23 Last Refill: 09/23/22 #30 and 0RF   Please Advise

## 2022-11-08 ENCOUNTER — Other Ambulatory Visit: Payer: Self-pay | Admitting: Family

## 2022-11-29 ENCOUNTER — Other Ambulatory Visit: Payer: Self-pay | Admitting: Family

## 2022-11-30 MED ORDER — ALPRAZOLAM 0.5 MG PO TABS: 0.5000 mg | ORAL_TABLET | Freq: Two times a day (BID) | ORAL | 0 refills | Status: DC | PRN

## 2022-11-30 NOTE — Telephone Encounter (Signed)
Requesting: ALPRAZolam (XANAX) 0.5 MG tablet Take 1 tablet (0.5 mg total) by mouth 2 (two) times daily as needed for anxiety.  Contract: 04/06/2022 UDS: 10/02/2022 Last Visit: 10/02/2022 Next Visit: 03/30/2023 Last Refill: 10/26/2022 #30 no refills  Please Advise

## 2022-12-08 ENCOUNTER — Encounter: Payer: Self-pay | Admitting: Family

## 2022-12-29 ENCOUNTER — Other Ambulatory Visit: Payer: Self-pay | Admitting: Family

## 2022-12-29 MED ORDER — ALPRAZOLAM 0.5 MG PO TABS: 0.5000 mg | ORAL_TABLET | Freq: Two times a day (BID) | ORAL | 0 refills | Status: DC | PRN

## 2022-12-29 NOTE — Telephone Encounter (Signed)
Requesting: ALPRAZolam (XANAX) 0.5 MG tablet Take 1 tablet (0.5 mg total) by mouth 2 (two) times daily as needed for anxiety.  Contract: 04/06/2022 UDS: 10/02/2022 Last Visit: 10/02/2022 Next Visit: 03/30/2023 Last Refill: 11/30/2022 #30 no refills

## 2023-02-01 ENCOUNTER — Other Ambulatory Visit: Payer: Self-pay | Admitting: Family

## 2023-02-01 NOTE — Telephone Encounter (Signed)
Requesting: alprazolam 0.5mg   Contract: 04/08/22 UDS: 10/02/22 Last Visit: 10/02/22 Next Visit: 03/30/23 Last Refill:12/29/22 #30 and 0RF  Please Advise

## 2023-02-02 MED ORDER — ALPRAZOLAM 0.5 MG PO TABS: 0.5000 mg | ORAL_TABLET | Freq: Two times a day (BID) | ORAL | 0 refills | Status: DC | PRN

## 2023-02-04 ENCOUNTER — Other Ambulatory Visit: Payer: Self-pay | Admitting: Family

## 2023-02-06 ENCOUNTER — Other Ambulatory Visit: Payer: Self-pay | Admitting: Family

## 2023-03-06 ENCOUNTER — Other Ambulatory Visit: Payer: Self-pay | Admitting: Family

## 2023-03-08 MED ORDER — ALPRAZOLAM 0.5 MG PO TABS: 0.5000 mg | ORAL_TABLET | Freq: Two times a day (BID) | ORAL | 0 refills | Status: DC | PRN

## 2023-03-08 NOTE — Telephone Encounter (Signed)
Requesting: alprazolam 0.5mg   Contract: 04/08/22 UDS: 10/02/22 Last Visit: 10/02/22 Next Visit: 03/30/23 Last Refill: 02/02/23 #30 and 0RF   Please Advise

## 2023-03-30 ENCOUNTER — Encounter: Payer: Self-pay | Admitting: Family

## 2023-03-30 ENCOUNTER — Ambulatory Visit: Payer: BC Managed Care – PPO | Admitting: Family

## 2023-03-30 VITALS — BP 120/67 | HR 60 | Temp 97.7°F | Resp 16 | Ht 70.5 in | Wt 276.0 lb

## 2023-03-30 DIAGNOSIS — E782 Mixed hyperlipidemia: Secondary | ICD-10-CM | POA: Diagnosis not present

## 2023-03-30 DIAGNOSIS — F321 Major depressive disorder, single episode, moderate: Secondary | ICD-10-CM

## 2023-03-30 DIAGNOSIS — Z125 Encounter for screening for malignant neoplasm of prostate: Secondary | ICD-10-CM | POA: Diagnosis not present

## 2023-03-30 DIAGNOSIS — Z23 Encounter for immunization: Secondary | ICD-10-CM

## 2023-03-30 DIAGNOSIS — L709 Acne, unspecified: Secondary | ICD-10-CM | POA: Diagnosis not present

## 2023-03-30 DIAGNOSIS — I10 Essential (primary) hypertension: Secondary | ICD-10-CM | POA: Diagnosis not present

## 2023-03-30 DIAGNOSIS — K219 Gastro-esophageal reflux disease without esophagitis: Secondary | ICD-10-CM

## 2023-03-30 DIAGNOSIS — R252 Cramp and spasm: Secondary | ICD-10-CM | POA: Diagnosis not present

## 2023-03-30 DIAGNOSIS — Z0000 Encounter for general adult medical examination without abnormal findings: Secondary | ICD-10-CM | POA: Diagnosis not present

## 2023-03-30 LAB — LIPID PANEL
Cholesterol: 181 mg/dL (ref 0–200)
HDL: 54.8 mg/dL (ref >39.00)
LDL Cholesterol: 113 mg/dL — ABNORMAL HIGH (ref 0–99)
NonHDL: 126.26
Total CHOL/HDL Ratio: 3
Triglycerides: 65 mg/dL (ref 0.0–149.0)
VLDL: 13 mg/dL (ref 0.0–40.0)

## 2023-03-30 LAB — COMPREHENSIVE METABOLIC PANEL
ALT: 21 U/L (ref 0–53)
AST: 19 U/L (ref 0–37)
Albumin: 4.5 g/dL (ref 3.5–5.2)
Alkaline Phosphatase: 47 U/L (ref 39–117)
BUN: 11 mg/dL (ref 6–23)
CO2: 30 meq/L (ref 19–32)
Calcium: 10.1 mg/dL (ref 8.4–10.5)
Chloride: 102 meq/L (ref 96–112)
Creatinine, Ser: 1.27 mg/dL (ref 0.40–1.50)
GFR: 65.51 mL/min (ref >60.00)
Glucose, Bld: 94 mg/dL (ref 70–99)
Potassium: 4.7 meq/L (ref 3.5–5.1)
Sodium: 138 meq/L (ref 135–145)
Total Bilirubin: 1.1 mg/dL (ref 0.2–1.2)
Total Protein: 7.1 g/dL (ref 6.0–8.3)

## 2023-03-30 LAB — PSA: PSA: 2.11 ng/mL (ref 0.10–4.00)

## 2023-03-30 MED ORDER — BUPROPION HCL ER (XL) 150 MG PO TB24: 150.0000 mg | ORAL_TABLET | Freq: Every day | ORAL | 0 refills | Status: DC

## 2023-03-30 NOTE — Assessment & Plan Note (Addendum)
Has had a lot of stress.  Wife is concerned that he is depressed.  Has tried zoloft but had weight gain. Trial of wellbutrin xl.

## 2023-03-30 NOTE — Patient Instructions (Signed)
VISIT SUMMARY:  During your visit, we discussed your concerns about fluctuating weight, occasional muscle and joint pain, digestive issues, and your mental health. We also reviewed your hypertension management and the need for vaccinations. We discussed the need for vision and dental check-ups, and the importance of monitoring your gastrointestinal health. We also talked about your general health maintenance, including checking cholesterol levels and a PSA prostate blood test.  YOUR PLAN:  -DEPRESSION AND ANXIETY: We decided to add Wellbutrin to your current medication, Buspar, to help manage your mood fluctuations. Wellbutrin is known for its energizing effects and potential for weight loss.  -HYPERTENSION: Your blood pressure is well controlled with Amlodipine and Lisinopril. Please continue taking these medications as prescribed.  -VACCINATIONS: You received your flu vaccine today. Please also get an updated COVID vaccine at a local pharmacy, as it has been over six months since your last dose.  -VISION AND DENTAL HEALTH: Please check your insurance coverage for dental work and schedule a vision check as soon as possible.  -GASTROINTESTINAL HEALTH: Monitor your digestive health and report any changes such as burning or difficulty urinating.  -MUSCULOSKELETAL HEALTH: We will check your electrolyte levels to see if any deficiencies might be contributing to your muscle cramps.  -GENERAL HEALTH MAINTENANCE: We will check your cholesterol levels and order a PSA prostate blood test. Please continue with your plans for couples therapy and individual therapy for your wife. Also, maintain a healthy diet and increase physical activity as you are able.  INSTRUCTIONS:  Please start taking Wellbutrin extended release once daily in the morning. Continue taking Amlodipine and Lisinopril for your hypertension. Get your COVID vaccine updated at a local pharmacy. Check your insurance coverage for dental work  and schedule a vision check. Monitor your digestive health and report any changes. We will check your electrolyte levels, cholesterol levels, and order a PSA prostate blood test. Continue with your plans for couples therapy and individual therapy for your wife. Maintain a healthy diet and increase physical activity as you are able.

## 2023-03-30 NOTE — Progress Notes (Signed)
Subjective:     Patient ID: Chad Mejia, male    DOB: 10/31/1970, 52 y.o.   MRN: [id number]  Chief Complaint  Patient presents with   Annual Exam    HPI  Discussed the use of AI scribe software for clinical note transcription with the patient, who gave verbal consent to proceed.  History of Present Illness    The patient, with a history of hypertension and anxiety, presents with multiple concerns and for follow up visit. He reports fluctuating weight, expressing dissatisfaction with his current weight. He also mentions occasional muscle and joint pain, including a recently broken toe on the right lateral foot and a persistent issue with his hand. The patient has been experiencing digestive issues, noting increased frequency of bowel movements and occasional minor accidents. He also reports a history of headaches, but notes that these have not been a significant issue recently.  The patient's mental health is a significant concern. He reports feeling more anxious than in the past and believes he could benefit from additional treatment. He is currently taking Buspar for anxiety, which he feels is effective for managing acute anxiety attacks. However, he expresses a need for additional support for ongoing depression issues, which he describes as "ups and downs." The patient's spouse, a psychologist, has suggested that the patient may be experiencing depression.     Immunizations: will get covid booster at pharmacy, flu shot today, tap up to date Diet: diet is "up and down."  Wt Readings from Last 3 Encounters:  03/30/23 276 lb (125.2 kg)  10/02/22 278 lb (126.1 kg)  03/27/22 275 lb 9.6 oz (125 kg)  Exercise: limited due to toe injury  Colonoscopy: 3/25 Lab Results  Component Value Date   PSA 1.10 08/28/2016  Vision: due  Dental: due    Health Maintenance Due  Topic Date Due   COVID-19 Vaccine (5 - 2023-24 season) 02/14/2023    Past Medical History:  Diagnosis Date    Anxiety    Depression    GERD (gastroesophageal reflux disease)    History of migraine    Hyperlipidemia    Hypertension     Past Surgical History:  Procedure Laterality Date   ELBOW SURGERY Left    "shattered elbow as a child"    Family History  Problem Relation Age of Onset   Hyperlipidemia Mother    Hypertension Mother    Drug abuse Sister        died from drug overdose, 1/2 sister   Arthritis Maternal Grandmother    Schizophrenia Maternal Aunt    Colon cancer Neg Hx    Colon polyps Neg Hx    Esophageal cancer Neg Hx    Rectal cancer Neg Hx    Stomach cancer Neg Hx     Social History   Socioeconomic History   Marital status: Married    Spouse name: Not on file   Number of children: Not on file   Years of education: Not on file   Highest education level: Bachelor's degree (e.g., BA, AB, BS)  Occupational History   Not on file  Tobacco Use   Smoking status: Never    Passive exposure: Yes   Smokeless tobacco: Never  Vaping Use   Vaping status: Never Used  Substance and Sexual Activity   Alcohol use: Yes    Comment: rarely   Drug use: No   Sexual activity: Yes    Partners: Female  Other Topics Concern   Not on file  Social History Narrative   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     Single, no children   Married   Has a cat   Enjoys painting,writing, reading   Bachelors degree   Social Determinants of Health   Financial Resource Strain: Patient Declined (09/26/2022)   Overall Financial Resource Strain (CARDIA)    Difficulty of Paying Living Expenses: Patient declined  Food Insecurity: Patient Declined (09/26/2022)   Hunger Vital Sign    Worried About Running Out of Food in the Last Year: Patient declined    Ran Out of Food in the Last Year: Patient declined  Transportation Needs: No Transportation Needs (09/26/2022)   PRAPARE - Administrator, Civil Service (Medical): No    Lack of Transportation (Non-Medical): No  Physical  Activity: Insufficiently Active (09/26/2022)   Exercise Vital Sign    Days of Exercise per Week: 5 days    Minutes of Exercise per Session: 20 min  Stress: Stress Concern Present (09/26/2022)   [employer] of Occupational Health - Occupational Stress Questionnaire    Feeling of Stress : Very much  Social Connections: Unknown (09/26/2022)   Social Connection and Isolation Panel [NHANES]    Frequency of Communication with Friends and Family: Patient declined    Frequency of Social Gatherings with Friends and Family: Patient declined    Attends Religious Services: Patient declined    Database administrator or Organizations: Patient declined    Attends Engineer, structural: Not on file    Marital Status: Married  Catering manager Violence: Not on file    Outpatient Medications Prior to Visit  Medication Sig Dispense Refill   ALPRAZolam (XANAX) 0.5 MG tablet Take 1 tablet (0.5 mg total) by mouth 2 (two) times daily as needed for anxiety. 30 tablet 0   amLODipine (NORVASC) 10 MG tablet TAKE 1 TABLET(10 MG) BY MOUTH DAILY 90 tablet 1   aspirin EC 81 MG tablet Take 81 mg by mouth daily. Does not take consistently.     benzoyl peroxide-erythromycin (BENZAMYCIN) gel Apply topically 2 (two) times daily. 23.3 g 5   busPIRone (BUSPAR) 15 MG tablet Take 1 tablet (15 mg total) by mouth 2 (two) times daily. 180 tablet 1   lisinopril (ZESTRIL) 10 MG tablet TAKE 1/2 TABLET(5 MG) BY MOUTH DAILY 45 tablet 0   VITAMIN D PO Take by mouth.     No facility-administered medications prior to visit.    No Known Allergies  Review of Systems  Constitutional:  Negative for weight loss.  HENT:  Negative for congestion and hearing loss.   Eyes:  Positive for blurred vision (reading issues).  Respiratory:  Negative for cough.   Cardiovascular:  Negative for leg swelling.  Gastrointestinal:  Negative for constipation and diarrhea.  Genitourinary:  Negative for dysuria and frequency.   Musculoskeletal:  Negative for joint pain and myalgias.  Skin:  Negative for rash.  Neurological:  Negative for headaches.  Psychiatric/Behavioral:         See HPI        Objective:    Physical Exam   BP 120/67 (BP Location: Right Arm, Patient Position: Sitting, Cuff Size: Large)   Pulse 60   Temp 97.7 F (36.5 C) (Oral)   Resp 16   Ht 5' 10.5" (1.791 m)   Wt 276 lb (125.2 kg)   SpO2 100%   BMI 39.04 kg/m  Wt Readings from Last 3 Encounters:  03/30/23 276 lb (125.2 kg)  10/02/22  278 lb (126.1 kg)  03/27/22 275 lb 9.6 oz (125 kg)   Physical Exam  Constitutional: He is oriented to person, place, and time. He appears well-developed and well-nourished. No distress.  HENT:  Head: Normocephalic and atraumatic.  Right Ear: Tympanic membrane and ear canal normal.  Left Ear: Tympanic membrane and ear canal normal.  Mouth/Throat: Oropharynx is clear and moist.  Eyes: Pupils are equal, round, and reactive to light. No scleral icterus.  Neck: Normal range of motion. No thyromegaly present.  Cardiovascular: Normal rate and regular rhythm.   No murmur heard. Pulmonary/Chest: Effort normal and breath sounds normal. No respiratory distress. He has no wheezes. He has no rales. He exhibits no tenderness.  Abdominal: Soft. Bowel sounds are normal. He exhibits no distension and no mass. There is no tenderness. There is no rebound and no guarding.  Musculoskeletal: He exhibits no edema.  Lymphadenopathy:    He has no cervical adenopathy.  Neurological: He is alert and oriented to person, place, and time. He has normal patellar reflexes. He exhibits normal muscle tone. Coordination normal.  Skin: Skin is warm and dry.  Psychiatric: He has a normal mood and affect. His behavior is normal. Judgment and thought content normal.           Assessment & Plan:       Assessment & Plan:   Problem List Items Addressed This Visit       Unprioritized   Acne (Chronic)    Overall stable.   Continue benzamycin gel.       Routine adult health maintenance - Primary     Last COVID vaccine was over six months ago, flu vaccine due today. -Administer flu vaccine today. -Recommend getting updated COVID vaccine at a local pharmacy.  Vision and Dental Health Reports needing vision check and dental work, but has been delaying due to cost. -Encourage patient to check insurance coverage for dental work and to schedule vision check. -Check cholesterol levels. -Order PSA prostate blood test. -Advise patient to continue with plans for couples therapy and individual therapy for wife. -Encourage patient to maintain a healthy diet and increase physical activity as able.      Major depression    Has had a lot of stress.  Wife is concerned that he is depressed.  Has tried zoloft but had weight gain. Trial of wellbutrin xl.          Relevant Medications   buPROPion (WELLBUTRIN XL) 150 MG 24 hr tablet   Other Relevant Orders   Lipid panel   Hyperlipidemia    Lab Results  Component Value Date   CHOL 173 03/27/2022   HDL 58.50 03/27/2022   LDLCALC 102 (H) 03/27/2022   TRIG 64.0 03/27/2022   CHOLHDL 3 03/27/2022   Last lipid panel was stable. Will update.       HTN (hypertension)    BP Readings from Last 3 Encounters:  03/30/23 120/67  10/02/22 100/60  03/27/22 120/70   Maintained on amlodipine and lisinopril bp stable.       Relevant Orders   Lipid panel   GERD (gastroesophageal reflux disease)    Stable with prn tums.       Other Visit Diagnoses     Screening for prostate cancer       Relevant Orders   PSA   Muscle cramping       Relevant Orders   Comp Met (CMET)   Needs flu shot  Relevant Orders   Flu vaccine trivalent PF, 6mos and older(Flulaval,Afluria,Fluarix,Fluzone) (Completed)       I am having Aneta Mins E. Hessel start on buPROPion. I am also having him maintain his benzoyl peroxide-erythromycin, VITAMIN D PO, aspirin EC, amLODipine,  busPIRone, lisinopril, and ALPRAZolam.  Meds ordered this encounter  Medications   buPROPion (WELLBUTRIN XL) 150 MG 24 hr tablet    Sig: Take 1 tablet (150 mg total) by mouth daily.    Dispense:  90 tablet    Refill:  0    Order Specific Question:   Supervising Provider    Answer:   Danise Edge A [[id number]]

## 2023-03-30 NOTE — Assessment & Plan Note (Signed)
Stable with prn tums.

## 2023-03-30 NOTE — Assessment & Plan Note (Signed)
  Last COVID vaccine was over six months ago, flu vaccine due today. -Administer flu vaccine today. -Recommend getting updated COVID vaccine at a local pharmacy.  Vision and Dental Health Reports needing vision check and dental work, but has been delaying due to cost. -Encourage patient to check insurance coverage for dental work and to schedule vision check. -Check cholesterol levels. -Order PSA prostate blood test. -Advise patient to continue with plans for couples therapy and individual therapy for wife. -Encourage patient to maintain a healthy diet and increase physical activity as able.

## 2023-03-30 NOTE — Assessment & Plan Note (Signed)
>>  ASSESSMENT AND PLAN FOR ROUTINE ADULT HEALTH MAINTENANCE WRITTEN ON 03/30/2023  7:50 AM BY O'SULLIVAN, Larene Ascencio, NP   Last COVID vaccine was over six months ago, flu vaccine due today. -Administer flu vaccine today. -Recommend getting updated COVID vaccine at a local pharmacy.  Vision and Dental Health Reports needing vision check and dental work, but has been delaying due to cost. -Encourage patient to check insurance coverage for dental work and to schedule vision check. -Check cholesterol levels. -Order PSA prostate blood test. -Advise patient to continue with plans for couples therapy and individual therapy for wife. -Encourage patient to maintain a healthy diet and increase physical activity as able.

## 2023-03-30 NOTE — Assessment & Plan Note (Addendum)
Lab Results  Component Value Date   CHOL 173 03/27/2022   HDL 58.50 03/27/2022   LDLCALC 102 (H) 03/27/2022   TRIG 64.0 03/27/2022   CHOLHDL 3 03/27/2022   Last lipid panel was stable. Will update.

## 2023-03-30 NOTE — Assessment & Plan Note (Addendum)
Overall stable.  Continue benzamycin gel.

## 2023-03-30 NOTE — Assessment & Plan Note (Signed)
BP Readings from Last 3 Encounters:  03/30/23 120/67  10/02/22 100/60  03/27/22 120/70   Maintained on amlodipine and lisinopril bp stable.

## 2023-04-08 ENCOUNTER — Other Ambulatory Visit: Payer: Self-pay | Admitting: Family

## 2023-04-09 MED ORDER — ALPRAZOLAM 0.5 MG PO TABS: 0.5000 mg | ORAL_TABLET | Freq: Two times a day (BID) | ORAL | 0 refills | Status: DC | PRN

## 2023-04-09 NOTE — Telephone Encounter (Signed)
Requesting:ALPRAZolam (XANAX) 0.5 MG tablet  Contract: 04/08/2022 UDS: 10/02/2022 Last Visit: 03/30/2023 Next Visit: 05/11/2023 Last Refill: 03/08/2023 #30 no refills  Please Advise

## 2023-05-07 ENCOUNTER — Other Ambulatory Visit: Payer: Self-pay | Admitting: Family

## 2023-05-11 ENCOUNTER — Ambulatory Visit: Payer: BC Managed Care – PPO | Admitting: Family

## 2023-05-11 VITALS — BP 114/75 | HR 60 | Temp 97.7°F | Resp 16 | Ht 70.5 in | Wt 274.0 lb

## 2023-05-11 DIAGNOSIS — F419 Anxiety disorder, unspecified: Secondary | ICD-10-CM | POA: Diagnosis not present

## 2023-05-11 DIAGNOSIS — I10 Essential (primary) hypertension: Secondary | ICD-10-CM

## 2023-05-11 DIAGNOSIS — F321 Major depressive disorder, single episode, moderate: Secondary | ICD-10-CM

## 2023-05-11 MED ORDER — BUPROPION HCL ER (XL) 150 MG PO TB24: 150.0000 mg | ORAL_TABLET | Freq: Every day | ORAL | 0 refills | Status: DC

## 2023-05-11 MED ORDER — LISINOPRIL 10 MG PO TABS: 10.0000 mg | ORAL_TABLET | Freq: Every day | ORAL | 1 refills | Status: DC

## 2023-05-11 MED ORDER — ALPRAZOLAM 0.5 MG PO TABS
0.5000 mg | ORAL_TABLET | Freq: Two times a day (BID) | ORAL | 0 refills | Status: DC | PRN
Start: 2023-05-11 — End: 2023-06-28

## 2023-05-11 NOTE — Progress Notes (Signed)
Subjective:     Patient ID: Chad Mejia, male    DOB: 1970-10-29, 52 y.o.   MRN: [id number]  Chief Complaint  Patient presents with   Depression    Here for follow up    Depression         Discussed the use of AI scribe software for clinical note transcription with the patient, who gave verbal consent to proceed.  History of Present Illness   The patient, with a history of depression and anxiety, reports an improvement in mood and a decrease in volatility since starting Wellbutrin. He notes a quicker recovery from emotional disturbances and a decrease in anxiety attacks. The patient's spouse also observes these improvements. The patient also reports a decrease in appetite, which he attributes to the medication. He also mentions a toe injury, which he believes may have been a fracture, but did not seek medical attention for it. The patient reports no significant side effects from the Wellbutrin.       Wt Readings from Last 3 Encounters:  05/11/23 274 lb (124.3 kg)  03/30/23 276 lb (125.2 kg)  10/02/22 278 lb (126.1 kg)     Health Maintenance Due  Topic Date Due   COVID-19 Vaccine (5 - 2023-24 season) 02/14/2023   Colonoscopy  09/06/2023    Past Medical History:  Diagnosis Date   Anxiety    Depression    GERD (gastroesophageal reflux disease)    History of migraine    Hyperlipidemia    Hypertension     Past Surgical History:  Procedure Laterality Date   ELBOW SURGERY Left    "shattered elbow as a child"    Family History  Problem Relation Age of Onset   Hyperlipidemia Mother    Hypertension Mother    Drug abuse Sister        died from drug overdose, 1/2 sister   Arthritis Maternal Grandmother    Schizophrenia Maternal Aunt    Colon cancer Neg Hx    Colon polyps Neg Hx    Esophageal cancer Neg Hx    Rectal cancer Neg Hx    Stomach cancer Neg Hx     Social History   Socioeconomic History   Marital status: Married    Spouse name: Not on file    Number of children: Not on file   Years of education: Not on file   Highest education level: Bachelor's degree (e.g., BA, AB, BS)  Occupational History   Not on file  Tobacco Use   Smoking status: Never    Passive exposure: Yes   Smokeless tobacco: Never  Vaping Use   Vaping status: Never Used  Substance and Sexual Activity   Alcohol use: Yes    Comment: rarely   Drug use: No   Sexual activity: Yes    Partners: Female  Other Topics Concern   Not on file  Social History Narrative   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     Single, no children   Married   Has a cat   Enjoys painting,writing, reading   Bachelors degree   Social Determinants of Health   Financial Resource Strain: High Risk (05/04/2023)   Overall Financial Resource Strain (CARDIA)    Difficulty of Paying Living Expenses: Hard  Food Insecurity: Food Insecurity Present (05/04/2023)   Hunger Vital Sign    Worried About Running Out of Food in the Last Year: Sometimes true    Ran Out of Food in the Last Year: Never  true  Transportation Needs: No Transportation Needs (05/04/2023)   PRAPARE - Administrator, Civil Service (Medical): No    Lack of Transportation (Non-Medical): No  Physical Activity: Insufficiently Active (05/04/2023)   Exercise Vital Sign    Days of Exercise per Week: 5 days    Minutes of Exercise per Session: 20 min  Stress: Stress Concern Present (05/04/2023)   [employer] of Occupational Health - Occupational Stress Questionnaire    Feeling of Stress : Very much  Social Connections: Moderately Isolated (05/04/2023)   Social Connection and Isolation Panel [NHANES]    Frequency of Communication with Friends and Family: More than three times a week    Frequency of Social Gatherings with Friends and Family: Once a week    Attends Religious Services: Never    Database administrator or Organizations: No    Attends Engineer, structural: Not on file    Marital  Status: Married  Catering manager Violence: Not on file    Outpatient Medications Prior to Visit  Medication Sig Dispense Refill   amLODipine (NORVASC) 10 MG tablet TAKE 1 TABLET(10 MG) BY MOUTH DAILY 90 tablet 1   aspirin EC 81 MG tablet Take 81 mg by mouth daily. Does not take consistently.     benzoyl peroxide-erythromycin (BENZAMYCIN) gel Apply topically 2 (two) times daily. 23.3 g 5   busPIRone (BUSPAR) 15 MG tablet Take 1 tablet (15 mg total) by mouth 2 (two) times daily. 180 tablet 1   VITAMIN D PO Take by mouth.     ALPRAZolam (XANAX) 0.5 MG tablet Take 1 tablet (0.5 mg total) by mouth 2 (two) times daily as needed for anxiety. 30 tablet 0   buPROPion (WELLBUTRIN XL) 150 MG 24 hr tablet Take 1 tablet (150 mg total) by mouth daily. 90 tablet 0   lisinopril (ZESTRIL) 10 MG tablet TAKE 1/2 TABLET(5 MG) BY MOUTH DAILY 45 tablet 0   No facility-administered medications prior to visit.    No Known Allergies  Review of Systems  Psychiatric/Behavioral:  Positive for depression.    See HPI    Objective:    Physical Exam Constitutional:      Appearance: Normal appearance.  Neurological:     Mental Status: He is alert and oriented to person, place, and time.  Psychiatric:        Mood and Affect: Mood normal.        Behavior: Behavior normal.        Thought Content: Thought content normal.        Judgment: Judgment normal.      BP 114/75 (BP Location: Left Arm, Patient Position: Sitting, Cuff Size: Large)   Pulse 60   Temp 97.7 F (36.5 C) (Oral)   Resp 16   Ht 5' 10.5" (1.791 m)   Wt 274 lb (124.3 kg)   SpO2 100%   BMI 38.76 kg/m  Wt Readings from Last 3 Encounters:  05/11/23 274 lb (124.3 kg)  03/30/23 276 lb (125.2 kg)  10/02/22 278 lb (126.1 kg)       Assessment & Plan:   Problem List Items Addressed This Visit       Unprioritized   Major depression     Improvement noted with Wellbutrin. Less volatility, quicker recovery from stressors, and decreased  anxiety symptoms reported. No significant side effects noted. -Continue Wellbutrin at current dose. -Reevaluate in three months or sooner if symptoms worsen.      Relevant Medications  ALPRAZolam (XANAX) 0.5 MG tablet   buPROPion (WELLBUTRIN XL) 150 MG 24 hr tablet   Hypertension   Relevant Medications   lisinopril (ZESTRIL) 10 MG tablet   Anxiety - Primary     Improvement noted with current regimen. No recent anxiety attacks reported. -Continue current regimen including Buspar twice daily and prn xanax.      Relevant Medications   ALPRAZolam (XANAX) 0.5 MG tablet   buPROPion (WELLBUTRIN XL) 150 MG 24 hr tablet    I have changed Aneta Mins E. Ezra's lisinopril. I am also having him maintain his benzoyl peroxide-erythromycin, VITAMIN D PO, aspirin EC, amLODipine, busPIRone, ALPRAZolam, and buPROPion.  Meds ordered this encounter  Medications   ALPRAZolam (XANAX) 0.5 MG tablet    Sig: Take 1 tablet (0.5 mg total) by mouth 2 (two) times daily as needed for anxiety.    Dispense:  30 tablet    Refill:  0    Order Specific Question:   Supervising Provider    Answer:   Danise Edge A [[id number]]   lisinopril (ZESTRIL) 10 MG tablet    Sig: Take 1 tablet (10 mg total) by mouth daily.    Dispense:  45 tablet    Refill:  1    Order Specific Question:   Supervising Provider    Answer:   Danise Edge A [[id number]]   buPROPion (WELLBUTRIN XL) 150 MG 24 hr tablet    Sig: Take 1 tablet (150 mg total) by mouth daily.    Dispense:  90 tablet    Refill:  0    Order Specific Question:   Supervising Provider    Answer:   Danise Edge A [[id number]]

## 2023-05-11 NOTE — Patient Instructions (Signed)
VISIT SUMMARY:  During today's visit, we discussed your ongoing treatment for depression and anxiety. You reported significant improvements in your mood and a decrease in anxiety attacks since starting Wellbutrin. Additionally, we reviewed your general health maintenance needs.  YOUR PLAN:  -DEPRESSION: Depression is a mood disorder characterized by persistent feelings of sadness and loss of interest. You have reported improvements in your mood and a decrease in emotional volatility since starting Wellbutrin. We will continue your current dose of Wellbutrin and reevaluate in three months or sooner if your symptoms worsen.  -ANXIETY: Anxiety is a condition characterized by excessive worry and fear. You have noted improvements with your current treatment regimen and have not experienced any recent anxiety attacks. We will continue your current regimen, including Buspar twice daily.  -GENERAL HEALTH MAINTENANCE: For your general health, you are due for a colonoscopy in March. Please schedule this with gastroenterology. Additionally, refills for Lisinopril and Xanax have been sent to [facility], [facility].  INSTRUCTIONS:  Please schedule your colonoscopy with gastroenterology as it is due in March. Continue taking your medications as prescribed and follow up in three months or sooner if your symptoms worsen.

## 2023-05-11 NOTE — Assessment & Plan Note (Signed)
  Improvement noted with current regimen. No recent anxiety attacks reported. -Continue current regimen including Buspar twice daily and prn xanax.

## 2023-05-11 NOTE — Assessment & Plan Note (Signed)
  Improvement noted with Wellbutrin. Less volatility, quicker recovery from stressors, and decreased anxiety symptoms reported. No significant side effects noted. -Continue Wellbutrin at current dose. -Reevaluate in three months or sooner if symptoms worsen.

## 2023-05-11 NOTE — Assessment & Plan Note (Signed)
BP stable on lisinopril. Continue same.

## 2023-05-26 ENCOUNTER — Other Ambulatory Visit: Payer: Self-pay | Admitting: Family

## 2023-06-02 DIAGNOSIS — H02831 Dermatochalasis of right upper eyelid: Secondary | ICD-10-CM | POA: Diagnosis not present

## 2023-06-02 DIAGNOSIS — H5203 Hypermetropia, bilateral: Secondary | ICD-10-CM | POA: Diagnosis not present

## 2023-06-02 DIAGNOSIS — H02834 Dermatochalasis of left upper eyelid: Secondary | ICD-10-CM | POA: Diagnosis not present

## 2023-06-28 ENCOUNTER — Other Ambulatory Visit: Payer: Self-pay | Admitting: Family

## 2023-06-28 DIAGNOSIS — F419 Anxiety disorder, unspecified: Secondary | ICD-10-CM

## 2023-06-28 NOTE — Telephone Encounter (Signed)
 Requesting: Xanax 0.5 mg  Contract: 04/08/2022 UDS: 10/02/2022 Last Visit: 05/11/2023 Next Visit: 08/11/2023 Last Refill: 05/11/2023  Please Advise

## 2023-06-29 MED ORDER — ALPRAZOLAM 0.5 MG PO TABS
0.5000 mg | ORAL_TABLET | Freq: Two times a day (BID) | ORAL | 0 refills | Status: DC | PRN
Start: 2023-06-29 — End: 2023-08-01

## 2023-08-01 ENCOUNTER — Other Ambulatory Visit: Payer: Self-pay | Admitting: Family

## 2023-08-01 DIAGNOSIS — F419 Anxiety disorder, unspecified: Secondary | ICD-10-CM

## 2023-08-02 MED ORDER — ALPRAZOLAM 0.5 MG PO TABS
0.5000 mg | ORAL_TABLET | Freq: Two times a day (BID) | ORAL | 0 refills | Status: DC | PRN
Start: 2023-08-02 — End: 2023-09-07

## 2023-08-08 ENCOUNTER — Other Ambulatory Visit: Payer: Self-pay | Admitting: Medical Genetics

## 2023-08-08 ENCOUNTER — Encounter: Payer: Self-pay | Admitting: Family

## 2023-08-11 ENCOUNTER — Encounter: Payer: Self-pay | Admitting: Internal Medicine

## 2023-08-11 ENCOUNTER — Ambulatory Visit: Payer: BC Managed Care – PPO | Admitting: Family

## 2023-08-11 VITALS — BP 124/77 | HR 56 | Temp 97.7°F | Ht 70.5 in | Wt 272.0 lb

## 2023-08-11 DIAGNOSIS — Z1211 Encounter for screening for malignant neoplasm of colon: Secondary | ICD-10-CM

## 2023-08-11 DIAGNOSIS — R252 Cramp and spasm: Secondary | ICD-10-CM

## 2023-08-11 DIAGNOSIS — L709 Acne, unspecified: Secondary | ICD-10-CM | POA: Diagnosis not present

## 2023-08-11 DIAGNOSIS — F321 Major depressive disorder, single episode, moderate: Secondary | ICD-10-CM

## 2023-08-11 DIAGNOSIS — I10 Essential (primary) hypertension: Secondary | ICD-10-CM

## 2023-08-11 DIAGNOSIS — F419 Anxiety disorder, unspecified: Secondary | ICD-10-CM | POA: Diagnosis not present

## 2023-08-11 DIAGNOSIS — E782 Mixed hyperlipidemia: Secondary | ICD-10-CM

## 2023-08-11 MED ORDER — BUPROPION HCL ER (XL) 150 MG PO TB24: 150.0000 mg | ORAL_TABLET | Freq: Every day | ORAL | 1 refills | Status: DC

## 2023-08-11 MED ORDER — LISINOPRIL 10 MG PO TABS: 10.0000 mg | ORAL_TABLET | Freq: Every day | ORAL | 1 refills | Status: DC

## 2023-08-11 MED ORDER — BUSPIRONE HCL 15 MG PO TABS: 15.0000 mg | ORAL_TABLET | Freq: Two times a day (BID) | ORAL | 1 refills | Status: DC

## 2023-08-11 NOTE — Assessment & Plan Note (Signed)
 Stable on wellbutrin xl 150mg , conitnue same.

## 2023-08-11 NOTE — Progress Notes (Signed)
 Subjective:     Patient ID: Chad Mejia, male    DOB: 05-05-1971, 53 y.o.   MRN: [id number]  Chief Complaint  Patient presents with   Hypertension    Here for follow up   Anxiety    Here for follow up    Hypertension Associated symptoms include anxiety.  Anxiety      Discussed the use of AI scribe software for clinical note transcription with the patient, who gave verbal consent to proceed.  History of Present Illness    Chad Mejia is a 53 year old male with hypertension and anxiety who presents for a follow-up on medications.  He is currently taking Wellbutrin for mood stabilization and Goosebar for anxiety. He feels less irritable and more in control of his anger issues, as noted by his partner. However, he experienced a significant episode of anger at work, which left him exhausted, though such episodes have been infrequent.  For anxiety, identifying triggers has helped manage minor episodes. He uses buspirone as needed, particularly before driving home from work, which helps him calm down. No major anxiety episodes have occurred recently. Alprazolam is used primarily before his morning drive to work due to stress, with infrequent use otherwise. The last use at work was over a month ago during a particularly stressful day.  Blood pressure is well-controlled on amlodipine 10 mg and lisinopril 5 mg daily, having reduced the lisinopril dose from 10 mg. He uses scissors to cut the tablets.  He experiences occasional muscle cramps, primarily in his feet and hands, with less frequency than before. Some hand cramps are attributed to work activities, and stretching helps alleviate symptoms.  He has not used benzamycin gel for acne recently due to improvement in his skin condition, though he has noticed some dryness around his nose. He suspects environmental factors from his previous apartment may have contributed to past skin issues.  He takes daily walks for exercise  and has lost two pounds since his last visit. He encourages his partner to join him for walks to motivate each other.  There is a significant gap in his family history, particularly on his father's side, which he is interested in exploring further.   Health Maintenance Due  Topic Date Due   COVID-19 Vaccine (5 - 2024-25 season) 02/14/2023   Colonoscopy  09/06/2023    Past Medical History:  Diagnosis Date   Anxiety    Depression    GERD (gastroesophageal reflux disease)    History of migraine    Hyperlipidemia    Hypertension     Past Surgical History:  Procedure Laterality Date   ELBOW SURGERY Left    "shattered elbow as a child"    Family History  Problem Relation Age of Onset   Hyperlipidemia Mother    Hypertension Mother    Drug abuse Sister        died from drug overdose, 1/2 sister   Arthritis Maternal Grandmother    Schizophrenia Maternal Aunt    Colon cancer Neg Hx    Colon polyps Neg Hx    Esophageal cancer Neg Hx    Rectal cancer Neg Hx    Stomach cancer Neg Hx     Social History   Socioeconomic History   Marital status: Married    Spouse name: Not on file   Number of children: Not on file   Years of education: Not on file   Highest education level: Bachelor's degree (e.g., BA, AB, BS)  Occupational History   Not on file  Tobacco Use   Smoking status: Never    Passive exposure: Yes   Smokeless tobacco: Never  Vaping Use   Vaping status: Never Used  Substance and Sexual Activity   Alcohol use: Yes    Comment: rarely   Drug use: No   Sexual activity: Yes    Partners: Female  Other Topics Concern   Not on file  Social History Narrative   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     Single, no children   Married   Has a cat   Enjoys painting,writing, reading   Bachelors degree   Social Drivers of Health   Financial Resource Strain: Medium Risk (08/07/2023)   Overall Financial Resource Strain (CARDIA)    Difficulty of Paying  Living Expenses: Somewhat hard  Food Insecurity: No Food Insecurity (08/07/2023)   Hunger Vital Sign    Worried About Running Out of Food in the Last Year: Never true    Ran Out of Food in the Last Year: Never true  Transportation Needs: No Transportation Needs (08/07/2023)   PRAPARE - Administrator, Civil Service (Medical): No    Lack of Transportation (Non-Medical): No  Physical Activity: Insufficiently Active (08/07/2023)   Exercise Vital Sign    Days of Exercise per Week: 5 days    Minutes of Exercise per Session: 20 min  Stress: Stress Concern Present (08/07/2023)   [employer] of Occupational Health - Occupational Stress Questionnaire    Feeling of Stress : Rather much  Social Connections: Moderately Isolated (08/07/2023)   Social Connection and Isolation Panel [NHANES]    Frequency of Communication with Friends and Family: More than three times a week    Frequency of Social Gatherings with Friends and Family: Once a week    Attends Religious Services: Never    Database administrator or Organizations: No    Attends Engineer, structural: Not on file    Marital Status: Married  Catering manager Violence: Not on file    Outpatient Medications Prior to Visit  Medication Sig Dispense Refill   ALPRAZolam (XANAX) 0.5 MG tablet Take 1 tablet (0.5 mg total) by mouth 2 (two) times daily as needed for anxiety. 30 tablet 0   amLODipine (NORVASC) 10 MG tablet TAKE 1 TABLET(10 MG) BY MOUTH DAILY 90 tablet 1   aspirin EC 81 MG tablet Take 81 mg by mouth daily. Does not take consistently.     benzoyl peroxide-erythromycin (BENZAMYCIN) gel Apply topically 2 (two) times daily. 23.3 g 5   VITAMIN D PO Take by mouth.     buPROPion (WELLBUTRIN XL) 150 MG 24 hr tablet Take 1 tablet (150 mg total) by mouth daily. 90 tablet 0   busPIRone (BUSPAR) 15 MG tablet Take 1 tablet (15 mg total) by mouth 2 (two) times daily. 180 tablet 1   lisinopril (ZESTRIL) 10 MG tablet Take 1  tablet (10 mg total) by mouth daily. 45 tablet 1   No facility-administered medications prior to visit.    No Known Allergies  ROS     Objective:    Physical Exam Constitutional:      General: He is not in acute distress.    Appearance: He is well-developed.  HENT:     Head: Normocephalic and atraumatic.  Cardiovascular:     Rate and Rhythm: Normal rate and regular rhythm.     Heart sounds: No murmur heard. Pulmonary:  Effort: Pulmonary effort is normal. No respiratory distress.     Breath sounds: Normal breath sounds. No wheezing or rales.  Skin:    General: Skin is warm and dry.  Neurological:     Mental Status: He is alert and oriented to person, place, and time.  Psychiatric:        Behavior: Behavior normal.        Thought Content: Thought content normal.      BP 124/77 (BP Location: Right Arm, Patient Position: Sitting, Cuff Size: Large)   Pulse (!) 56   Temp 97.7 F (36.5 C) (Oral)   Ht 5' 10.5" (1.791 m)   Wt 272 lb (123.4 kg)   BMI 38.48 kg/m  Wt Readings from Last 3 Encounters:  08/11/23 272 lb (123.4 kg)  05/11/23 274 lb (124.3 kg)  03/30/23 276 lb (125.2 kg)       Assessment & Plan:   Problem List Items Addressed This Visit       Unprioritized   Acne (Chronic)   Has not needed benzamycin gel recently- stable, monitor.        Muscle cramps   Notes occasional cramping in hands/feet, rare- monitor.       Morbid obesity (HCC)   Wt Readings from Last 3 Encounters:  08/11/23 272 lb (123.4 kg)  05/11/23 274 lb (124.3 kg)  03/30/23 276 lb (125.2 kg)   Weight stable. Continue to work on [employer], exercise, weight loss.       Major depression   Stable on wellbutrin xl 150mg , conitnue same.        Relevant Medications   buPROPion (WELLBUTRIN XL) 150 MG 24 hr tablet   busPIRone (BUSPAR) 15 MG tablet   Hyperlipidemia   Lab Results  Component Value Date   CHOL 181 03/30/2023   HDL 54.80 03/30/2023   LDLCALC 113 (H) 03/30/2023    TRIG 65.0 03/30/2023   CHOLHDL 3 03/30/2023   Stable without meds, continue low fat diet.       Relevant Medications   lisinopril (ZESTRIL) 10 MG tablet   HTN (hypertension) - Primary   BP Readings from Last 3 Encounters:  08/11/23 124/77  05/11/23 114/75  03/30/23 120/67   At goal, continue lisinopril 5mg  and amlodipine 10mg         Relevant Medications   lisinopril (ZESTRIL) 10 MG tablet   Anxiety   Stable on buspar.  Continue same.        Relevant Medications   buPROPion (WELLBUTRIN XL) 150 MG 24 hr tablet   busPIRone (BUSPAR) 15 MG tablet   Other Relevant Orders   DRUG MONITORING, PANEL 8 WITH CONFIRMATION, URINE   Other Visit Diagnoses       Screening for colon cancer       Relevant Orders   Ambulatory referral to Gastroenterology       I am having Aneta Mins E. Brechtel maintain his benzoyl peroxide-erythromycin, VITAMIN D PO, aspirin EC, amLODipine, ALPRAZolam, buPROPion, lisinopril, and busPIRone.  Meds ordered this encounter  Medications   buPROPion (WELLBUTRIN XL) 150 MG 24 hr tablet    Sig: Take 1 tablet (150 mg total) by mouth daily.    Dispense:  90 tablet    Refill:  1    Supervising Provider:   Danise Edge A [[id number]]   lisinopril (ZESTRIL) 10 MG tablet    Sig: Take 1 tablet (10 mg total) by mouth daily.    Dispense:  45 tablet    Refill:  1  Supervising Provider:   Danise Edge A [[id number]]   busPIRone (BUSPAR) 15 MG tablet    Sig: Take 1 tablet (15 mg total) by mouth 2 (two) times daily.    Dispense:  180 tablet    Refill:  1    Supervising Provider:   Danise Edge A [[id number]]

## 2023-08-11 NOTE — Assessment & Plan Note (Signed)
 Notes occasional cramping in hands/feet, rare- monitor.

## 2023-08-11 NOTE — Assessment & Plan Note (Addendum)
 BP Readings from Last 3 Encounters:  08/11/23 124/77  05/11/23 114/75  03/30/23 120/67   At goal, continue lisinopril 5mg  and amlodipine 10mg 

## 2023-08-11 NOTE — Assessment & Plan Note (Signed)
 Wt Readings from Last 3 Encounters:  08/11/23 272 lb (123.4 kg)  05/11/23 274 lb (124.3 kg)  03/30/23 276 lb (125.2 kg)   Weight stable. Continue to work on [employer], exercise, weight loss.

## 2023-08-11 NOTE — Assessment & Plan Note (Signed)
 Stable on buspar.  Continue same.

## 2023-08-11 NOTE — Patient Instructions (Signed)
 VISIT SUMMARY:  Today, we reviewed your current medications and overall health. Your mood and anxiety are well-managed with your current medications, and your blood pressure remains well-controlled. We also discussed your muscle cramps, skin condition, and general health maintenance.  YOUR PLAN:  -MOOD DISORDER: Your mood has improved, and you have experienced less irritability while taking Wellbutrin. We will continue with your current dosage of Wellbutrin.  -ANXIETY: Your anxiety symptoms are better controlled with Buspirone, and you use Alprazolam infrequently for acute episodes. We will continue with your current medications as needed.  -HYPERTENSION: Your blood pressure is well-controlled with Amlodipine and Lisinopril. Hypertension means having high blood pressure, which can lead to serious health issues if not managed. We will continue with your current dosages of Amlodipine 10mg  and Lisinopril 5mg .  -MUSCLE CRAMPS: You experience occasional muscle cramps in your hands and feet, which have improved. Muscle cramps are sudden, involuntary contractions of one or more muscles. No changes to your current management are needed at this time.  -ACNE: Your skin condition has improved, and you have not had any recent flare-ups. Acne is a skin condition that occurs when hair follicles become plugged with oil and dead skin cells. Continue with your current skincare regimen.  -GENERAL HEALTH MAINTENANCE: We discussed the need for a colonoscopy in March 2025, updating your urine drug screen, and sending refills for Buspirone, Lisinopril, and Bupropion. Continue your daily walks and healthy lifestyle choices.  INSTRUCTIONS:  Please schedule a follow-up appointment in 6 months. Continue taking your medications as prescribed and maintain your daily walks. If you experience any new symptoms or have concerns, contact our office.

## 2023-08-11 NOTE — Assessment & Plan Note (Signed)
 Lab Results  Component Value Date   CHOL 181 03/30/2023   HDL 54.80 03/30/2023   LDLCALC 113 (H) 03/30/2023   TRIG 65.0 03/30/2023   CHOLHDL 3 03/30/2023   Stable without meds, continue low fat diet.

## 2023-08-11 NOTE — Assessment & Plan Note (Signed)
 Has not needed benzamycin gel recently- stable, monitor.

## 2023-08-12 ENCOUNTER — Encounter: Payer: Self-pay | Admitting: Internal Medicine

## 2023-08-19 ENCOUNTER — Other Ambulatory Visit (HOSPITAL_COMMUNITY)
Admission: RE | Admit: 2023-08-19 | Discharge: 2023-08-19 | Disposition: A | Discharge status: Home / Self Care | Payer: Self-pay | Source: Ambulatory Visit | Attending: Medical Genetics | Admitting: Medical Genetics

## 2023-08-30 ENCOUNTER — Encounter: Payer: Self-pay | Admitting: Family

## 2023-08-30 DIAGNOSIS — Z1211 Encounter for screening for malignant neoplasm of colon: Secondary | ICD-10-CM

## 2023-08-30 LAB — GENECONNECT MOLECULAR SCREEN: Genetic Analysis Overall Interpretation: NEGATIVE

## 2023-09-07 ENCOUNTER — Other Ambulatory Visit: Payer: Self-pay | Admitting: Family

## 2023-09-07 DIAGNOSIS — F419 Anxiety disorder, unspecified: Secondary | ICD-10-CM

## 2023-09-07 MED ORDER — ALPRAZOLAM 0.5 MG PO TABS: 0.5000 mg | ORAL_TABLET | Freq: Two times a day (BID) | ORAL | 0 refills | Status: DC | PRN

## 2023-09-07 NOTE — Telephone Encounter (Signed)
 Requesting: alprazolam 0.5mg  Contract: 04/08/22 UDS: 10/02/22 Last Visit: 08/11/23 Next Visit: 02/08/24 Last Refill: 08/02/23 #30 and 0RF   Please Advise

## 2023-09-25 ENCOUNTER — Other Ambulatory Visit: Payer: Self-pay | Admitting: Family

## 2023-09-25 DIAGNOSIS — F321 Major depressive disorder, single episode, moderate: Secondary | ICD-10-CM

## 2023-10-04 ENCOUNTER — Encounter: Payer: BC Managed Care – PPO | Admitting: Internal Medicine

## 2023-10-04 DIAGNOSIS — I10 Essential (primary) hypertension: Secondary | ICD-10-CM | POA: Diagnosis not present

## 2023-10-04 DIAGNOSIS — K621 Rectal polyp: Secondary | ICD-10-CM | POA: Diagnosis not present

## 2023-10-04 DIAGNOSIS — Z860100 Personal history of colon polyps, unspecified: Secondary | ICD-10-CM | POA: Diagnosis not present

## 2023-10-04 DIAGNOSIS — Z1211 Encounter for screening for malignant neoplasm of colon: Secondary | ICD-10-CM | POA: Diagnosis not present

## 2023-10-04 DIAGNOSIS — Z09 Encounter for follow-up examination after completed treatment for conditions other than malignant neoplasm: Secondary | ICD-10-CM | POA: Diagnosis not present

## 2023-10-17 ENCOUNTER — Other Ambulatory Visit: Payer: Self-pay | Admitting: Family

## 2023-10-17 DIAGNOSIS — F419 Anxiety disorder, unspecified: Secondary | ICD-10-CM

## 2023-10-18 MED ORDER — ALPRAZOLAM 0.5 MG PO TABS: 0.5000 mg | ORAL_TABLET | Freq: Two times a day (BID) | ORAL | 0 refills | Status: DC | PRN

## 2023-10-18 NOTE — Telephone Encounter (Signed)
 Requesting: alprazolam  0.5mg  Contract: Yes UDS: 10/02/22 (looks like it was not collected at lab in February) Last Visit: 08/11/2023 Next Visit: 02/08/2024 Last Refill: 09/07/23  Please Advise

## 2023-11-22 ENCOUNTER — Other Ambulatory Visit: Payer: Self-pay | Admitting: Family

## 2023-11-22 DIAGNOSIS — F419 Anxiety disorder, unspecified: Secondary | ICD-10-CM

## 2023-11-23 NOTE — Telephone Encounter (Signed)
 Requesting: alprazolam  0.5mg   Contract: 04/08/22 UDS: 10/02/22 Last Visit: 08/11/23 Next Visit: 02/08/24 Last Refill: 10/18/23 #30 and 0RF    Please Advise

## 2023-11-30 ENCOUNTER — Other Ambulatory Visit: Payer: Self-pay | Admitting: Family

## 2023-12-28 ENCOUNTER — Other Ambulatory Visit: Payer: Self-pay | Admitting: Family

## 2023-12-28 DIAGNOSIS — F419 Anxiety disorder, unspecified: Secondary | ICD-10-CM

## 2024-01-25 ENCOUNTER — Other Ambulatory Visit: Payer: Self-pay | Admitting: Family

## 2024-01-25 DIAGNOSIS — F419 Anxiety disorder, unspecified: Secondary | ICD-10-CM

## 2024-02-08 ENCOUNTER — Encounter: Payer: Self-pay | Admitting: Family

## 2024-02-08 ENCOUNTER — Ambulatory Visit: Payer: BC Managed Care – PPO | Admitting: Family

## 2024-02-08 ENCOUNTER — Ambulatory Visit: Payer: Self-pay | Admitting: Family

## 2024-02-08 VITALS — BP 112/71 | HR 64 | Temp 97.6°F | Resp 16 | Ht 70.5 in | Wt 268.0 lb

## 2024-02-08 DIAGNOSIS — Z23 Encounter for immunization: Secondary | ICD-10-CM | POA: Diagnosis not present

## 2024-02-08 DIAGNOSIS — E782 Mixed hyperlipidemia: Secondary | ICD-10-CM | POA: Diagnosis not present

## 2024-02-08 DIAGNOSIS — F419 Anxiety disorder, unspecified: Secondary | ICD-10-CM | POA: Diagnosis not present

## 2024-02-08 DIAGNOSIS — I10 Essential (primary) hypertension: Secondary | ICD-10-CM

## 2024-02-08 DIAGNOSIS — F321 Major depressive disorder, single episode, moderate: Secondary | ICD-10-CM

## 2024-02-08 DIAGNOSIS — K219 Gastro-esophageal reflux disease without esophagitis: Secondary | ICD-10-CM

## 2024-02-08 DIAGNOSIS — L709 Acne, unspecified: Secondary | ICD-10-CM

## 2024-02-08 LAB — BASIC METABOLIC PANEL WITH GFR
BUN: 11 mg/dL (ref 6–23)
CO2: 27 meq/L (ref 19–32)
Calcium: 9.3 mg/dL (ref 8.4–10.5)
Chloride: 103 meq/L (ref 96–112)
Creatinine, Ser: 1.19 mg/dL (ref 0.40–1.50)
GFR: 70.4 mL/min (ref >60.00)
Glucose, Bld: 93 mg/dL (ref 70–99)
Potassium: 4.2 meq/L (ref 3.5–5.1)
Sodium: 140 meq/L (ref 135–145)

## 2024-02-08 MED ORDER — BUSPIRONE HCL 15 MG PO TABS
15.0000 mg | ORAL_TABLET | Freq: Two times a day (BID) | ORAL | 1 refills | Status: AC
Start: 2024-02-08 — End: ?

## 2024-02-08 MED ORDER — AMLODIPINE BESYLATE 10 MG PO TABS: ORAL_TABLET | ORAL | 1 refills | Status: AC

## 2024-02-08 MED ORDER — BUPROPION HCL ER (XL) 150 MG PO TB24
150.0000 mg | ORAL_TABLET | Freq: Every day | ORAL | 1 refills | Status: AC
Start: 2024-02-08 — End: ?

## 2024-02-08 MED ORDER — LISINOPRIL 5 MG PO TABS: 5.0000 mg | ORAL_TABLET | Freq: Every day | ORAL | 1 refills | Status: AC

## 2024-02-08 NOTE — Assessment & Plan Note (Signed)
 Lab Results  Component Value Date   CHOL 181 03/30/2023   HDL 54.80 03/30/2023   LDLCALC 113 (H) 03/30/2023   TRIG 65.0 03/30/2023   CHOLHDL 3 03/30/2023   Stable without medication.  Monitor.

## 2024-02-08 NOTE — Assessment & Plan Note (Signed)
 Stable on amlodipine  10 and lisinopril  5mg .  Continue same.

## 2024-02-08 NOTE — Assessment & Plan Note (Signed)
 Stable without maintenance medication. Continues TUMS prn.

## 2024-02-08 NOTE — Progress Notes (Signed)
 Subjective:     Patient ID: Chad Mejia, male    DOB: 1970-08-10, 53 y.o.   MRN: [id number]  Chief Complaint  Patient presents with   Hypertension    Here for follow up   Anxiety    Patient reports increased in symptoms.     Hypertension Associated symptoms include anxiety.  Anxiety      Discussed the use of AI scribe software for clinical note transcription with the patient, who gave verbal consent to proceed.  History of Present Illness  Chad Mejia is a 53 year old male with hypertension and anxiety who presents for a follow-up on his medications.  He is taking amlodipine  10 mg and lisinopril  10 mg for blood pressure management. He finds it challenging to cut the lisinopril  tablets in half due to his small size. Wellbutrin  is effective for mood management, with a noted positive change. Recent stressors include a second miscarriage and Amanda's mother's declining health, affecting stress levels. Anxiety fluctuates, heightened over the past month as his wife recently suffered another miscarriage and his mother in law who suffers from dementia and lives with him and his wife, has had further decline.   He uses Xanax  primarily on weekdays in the morning before driving and occasionally on stressful days. Buspar  helps him calm down quicker and react less intensely. He has lost a few pounds recently and is pleased with this progress.     Health Maintenance Due  Topic Date Due   Hepatitis B Vaccines 19-59 Average Risk (1 of 3 - 19+ 3-dose series) Never done   Pneumococcal Vaccine: 50+ Years (1 of 1 - PCV) Never done   COVID-19 Vaccine (5 - 2024-25 season) 02/14/2023   INFLUENZA VACCINE  01/14/2024    Past Medical History:  Diagnosis Date   Anxiety    Depression    GERD (gastroesophageal reflux disease)    History of migraine    Hyperlipidemia    Hypertension     Past Surgical History:  Procedure Laterality Date   ELBOW SURGERY Left    shattered elbow as  a child    Family History  Problem Relation Age of Onset   Hyperlipidemia Mother    Hypertension Mother    Drug abuse Sister        died from drug overdose, 1/2 sister   Arthritis Maternal Grandmother    Schizophrenia Maternal Aunt    Colon cancer Neg Hx    Colon polyps Neg Hx    Esophageal cancer Neg Hx    Rectal cancer Neg Hx    Stomach cancer Neg Hx     Social History   Socioeconomic History   Marital status: Married    Spouse name: Not on file   Number of children: Not on file   Years of education: Not on file   Highest education level: Bachelor's degree (e.g., BA, AB, BS)  Occupational History   Not on file  Tobacco Use   Smoking status: Never    Passive exposure: Yes   Smokeless tobacco: Never  Vaping Use   Vaping status: Never Used  Substance and Sexual Activity   Alcohol use: Yes    Comment: rarely   Drug use: No   Sexual activity: Yes    Partners: Female  Other Topics Concern   Not on file  Social History Narrative   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     Single, no children   Married   Has a Medical laboratory scientific officer  Enjoys painting,writing, reading   Bachelors degree   Social Drivers of Health   Financial Resource Strain: Medium Risk (08/07/2023)   Overall Financial Resource Strain (CARDIA)    Difficulty of Paying Living Expenses: Somewhat hard  Food Insecurity: No Food Insecurity (02/02/2024)   Hunger Vital Sign    Worried About Running Out of Food in the Last Year: Never true    Ran Out of Food in the Last Year: Never true  Transportation Needs: No Transportation Needs (02/02/2024)   PRAPARE - Administrator, Civil Service (Medical): No    Lack of Transportation (Non-Medical): No  Physical Activity: Insufficiently Active (02/02/2024)   Exercise Vital Sign    Days of Exercise per Week: 5 days    Minutes of Exercise per Session: 20 min  Stress: Stress Concern Present (02/02/2024)   [employer] of Occupational Health - Occupational  Stress Questionnaire    Feeling of Stress: Very much  Social Connections: Moderately Isolated (02/02/2024)   Social Connection and Isolation Panel    Frequency of Communication with Friends and Family: Twice a week    Frequency of Social Gatherings with Friends and Family: Once a week    Attends Religious Services: Never    Database administrator or Organizations: No    Attends Engineer, structural: Not on file    Marital Status: Married  Catering manager Violence: Not on file    Outpatient Medications Prior to Visit  Medication Sig Dispense Refill   ALPRAZolam  (XANAX ) 0.5 MG tablet TAKE 1 TABLET(0.5 MG) BY MOUTH TWICE DAILY AS NEEDED FOR ANXIETY 30 tablet 0   amLODipine  (NORVASC ) 10 MG tablet TAKE 1 TABLET(10 MG) BY MOUTH DAILY 90 tablet 1   aspirin EC 81 MG tablet Take 81 mg by mouth daily. Does not take consistently.     benzoyl peroxide -erythromycin  (BENZAMYCIN) gel Apply topically 2 (two) times daily. 23.3 g 5   VITAMIN D PO Take by mouth.     buPROPion  (WELLBUTRIN  XL) 150 MG 24 hr tablet TAKE 1 TABLET(150 MG) BY MOUTH DAILY 90 tablet 1   busPIRone  (BUSPAR ) 15 MG tablet Take 1 tablet (15 mg total) by mouth 2 (two) times daily. 180 tablet 1   lisinopril  (ZESTRIL ) 10 MG tablet Take 1 tablet (10 mg total) by mouth daily. 45 tablet 1   No facility-administered medications prior to visit.    No Known Allergies  ROS See HPI    Objective:    Physical Exam Constitutional:      General: He is not in acute distress.    Appearance: He is well-developed.  HENT:     Head: Normocephalic and atraumatic.  Cardiovascular:     Rate and Rhythm: Normal rate and regular rhythm.     Heart sounds: No murmur heard. Pulmonary:     Effort: Pulmonary effort is normal. No respiratory distress.     Breath sounds: Normal breath sounds. No wheezing or rales.  Skin:    General: Skin is warm and dry.  Neurological:     Mental Status: He is alert and oriented to person, place, and time.   Psychiatric:        Behavior: Behavior normal.        Thought Content: Thought content normal.      BP 112/71 (BP Location: Right Arm, Patient Position: Sitting, Cuff Size: Large)   Pulse 64   Temp 97.6 F (36.4 C) (Oral)   Resp 16   Ht 5' 10.5 (1.791  m)   Wt 268 lb (121.6 kg)   SpO2 100%   BMI 37.91 kg/m  Wt Readings from Last 3 Encounters:  02/08/24 268 lb (121.6 kg)  08/11/23 272 lb (123.4 kg)  05/11/23 274 lb (124.3 kg)       Assessment & Plan:   Problem List Items Addressed This Visit       Unprioritized   Acne (Chronic)   Stable, has not needed topical rx.       Major depression   Reports feeling well on wellbutrin .  Continues same.       Relevant Medications   busPIRone  (BUSPAR ) 15 MG tablet   buPROPion  (WELLBUTRIN  XL) 150 MG 24 hr tablet   Hyperlipidemia   Lab Results  Component Value Date   CHOL 181 03/30/2023   HDL 54.80 03/30/2023   LDLCALC 113 (H) 03/30/2023   TRIG 65.0 03/30/2023   CHOLHDL 3 03/30/2023   Stable without medication.  Monitor.       Relevant Medications   lisinopril  (ZESTRIL ) 5 MG tablet   HTN (hypertension) - Primary   Stable on amlodipine  10 and lisinopril  5mg .  Continue same.       Relevant Medications   lisinopril  (ZESTRIL ) 5 MG tablet   Other Relevant Orders   Basic Metabolic Panel (BMET)   GERD (gastroesophageal reflux disease)   Stable without maintenance medication. Continues TUMS prn.      Anxiety   Reports that anxiety comes in waves. He continues xanax  prn.  Controlled substance contract is updated.       Relevant Medications   busPIRone  (BUSPAR ) 15 MG tablet   buPROPion  (WELLBUTRIN  XL) 150 MG 24 hr tablet   Other Relevant Orders   DRUG MONITORING, PANEL 8 WITH CONFIRMATION, URINE    I have discontinued Aubery E. Acord's lisinopril . I have also changed his buPROPion . Additionally, I am having him start on lisinopril . Lastly, I am having him maintain his benzoyl peroxide -erythromycin , VITAMIN D  PO, aspirin EC, amLODipine , ALPRAZolam , and busPIRone .  Meds ordered this encounter  Medications   lisinopril  (ZESTRIL ) 5 MG tablet    Sig: Take 1 tablet (5 mg total) by mouth daily.    Dispense:  90 tablet    Refill:  1    Supervising Provider:   DOMENICA BLACKBIRD A [[id number]]   busPIRone  (BUSPAR ) 15 MG tablet    Sig: Take 1 tablet (15 mg total) by mouth 2 (two) times daily.    Dispense:  180 tablet    Refill:  1    Supervising Provider:   DOMENICA BLACKBIRD A [[id number]]   buPROPion  (WELLBUTRIN  XL) 150 MG 24 hr tablet    Sig: Take 1 tablet (150 mg total) by mouth daily.    Dispense:  90 tablet    Refill:  1    Supervising Provider:   DOMENICA BLACKBIRD A [[id number]]

## 2024-02-08 NOTE — Assessment & Plan Note (Signed)
 Reports feeling well on wellbutrin .  Continues same.

## 2024-02-08 NOTE — Assessment & Plan Note (Addendum)
 Reports that anxiety comes in waves. He continues xanax  prn.  Controlled substance contract is updated.

## 2024-02-08 NOTE — Patient Instructions (Signed)
 VISIT SUMMARY:  You came in for a follow-up on your medications for hypertension and anxiety. We discussed your current medications, recent stressors, and overall health. Your blood pressure is well-controlled, and your mood has improved despite recent challenges.  YOUR PLAN:  DEPRESSION: Your depression is well-managed with Wellbutrin , and you have noticed a positive change in your mood. -Continue taking Wellbutrin . -A refill for Wellbutrin  has been sent to your pharmacy.  GENERALIZED ANXIETY DISORDER: Your anxiety has been variable, especially with recent personal and family stress. You use Xanax  for morning drives and acute stress, and BuSpar  has been effective in helping you calm down. -We have updated your Xanax  contract. -Continue taking BuSpar . -A refill for BuSpar  has been sent to your pharmacy.  HYPERTENSION: Your blood pressure is well-controlled with your current medications. -We have prescribed amlodipine  5 mg tablets to make dosing easier for you.  GENERAL HEALTH MAINTENANCE: We discussed vaccinations for hepatitis B and pneumonia, which are recommended for you. -You received the first dose of the hepatitis B vaccine today. -You received the pneumonia vaccine today. -Schedule a nurse visit for the second dose of the hepatitis B vaccine in one month.

## 2024-02-08 NOTE — Assessment & Plan Note (Signed)
 Stable, has not needed topical rx.

## 2024-02-11 LAB — DRUG MONITORING, PANEL 8 WITH CONFIRMATION, URINE
6 Acetylmorphine: NEGATIVE ng/mL (ref <10)
Alcohol Metabolites: NEGATIVE ng/mL (ref <500)
Alphahydroxyalprazolam: 59 ng/mL — ABNORMAL HIGH (ref <25)
Alphahydroxymidazolam: NEGATIVE ng/mL (ref <50)
Alphahydroxytriazolam: NEGATIVE ng/mL (ref <50)
Aminoclonazepam: NEGATIVE ng/mL (ref <25)
Amphetamines: NEGATIVE ng/mL (ref <500)
Benzodiazepines: POSITIVE ng/mL — AB (ref <100)
Buprenorphine, Urine: NEGATIVE ng/mL (ref <5)
Cocaine Metabolite: NEGATIVE ng/mL (ref <150)
Creatinine: 139.8 mg/dL (ref >=20.0)
Hydroxyethylflurazepam: NEGATIVE ng/mL (ref <50)
Lorazepam: NEGATIVE ng/mL (ref <50)
MDMA: NEGATIVE ng/mL (ref <500)
Marijuana Metabolite: NEGATIVE ng/mL (ref <20)
Nordiazepam: NEGATIVE ng/mL (ref <50)
Opiates: NEGATIVE ng/mL (ref <100)
Oxazepam: NEGATIVE ng/mL (ref <50)
Oxidant: NEGATIVE ug/mL (ref <200)
Oxycodone: NEGATIVE ng/mL (ref <100)
Temazepam: NEGATIVE ng/mL (ref <50)
pH: 6.9 (ref 4.5–9.0)

## 2024-02-11 LAB — DM TEMPLATE

## 2024-02-29 ENCOUNTER — Other Ambulatory Visit: Payer: Self-pay | Admitting: Family

## 2024-02-29 DIAGNOSIS — F419 Anxiety disorder, unspecified: Secondary | ICD-10-CM

## 2024-03-14 ENCOUNTER — Ambulatory Visit (INDEPENDENT_AMBULATORY_CARE_PROVIDER_SITE_OTHER)

## 2024-03-14 DIAGNOSIS — Z23 Encounter for immunization: Secondary | ICD-10-CM

## 2024-03-14 NOTE — Progress Notes (Signed)
 Patient presents today for Hep B vaccine per Eleanor Ponto on 02/08/2024: Return in about 5 weeks (around 03/14/2024) for nurse visit for Heplisav B #2   PT received vaccine in left deltoid and tolerated vaccine well.

## 2024-04-06 ENCOUNTER — Other Ambulatory Visit: Payer: Self-pay | Admitting: Family

## 2024-04-06 DIAGNOSIS — F419 Anxiety disorder, unspecified: Secondary | ICD-10-CM

## 2024-04-06 NOTE — Telephone Encounter (Signed)
 Requesting: alprazolam  0.5mg   Contract:02/08/24 UDS:02/08/24 Last Visit: 02/08/24 Next Visit:08/11/24 Last Refill: 03/01/24 #30 and 0RF   Please Advise

## 2024-05-14 ENCOUNTER — Other Ambulatory Visit: Payer: Self-pay | Admitting: Family

## 2024-05-14 DIAGNOSIS — F419 Anxiety disorder, unspecified: Secondary | ICD-10-CM

## 2024-05-15 NOTE — Telephone Encounter (Signed)
 Requesting: alprazolam  0.5mg   Contract:  02/08/24 UDS: 02/08/24 Last Visit: 02/08/24 Next Visit: 08/11/24 Last Refill: 04/07/24 #30 and 0RF   Please Advise

## 2024-06-16 ENCOUNTER — Other Ambulatory Visit: Payer: Self-pay | Admitting: Family

## 2024-06-16 DIAGNOSIS — F419 Anxiety disorder, unspecified: Secondary | ICD-10-CM

## 2024-06-16 NOTE — Telephone Encounter (Signed)
 Requesting: [email or web address]  0.5MG  Contract: Yes UDS: 02/08/24 Last Visit: 02/08/2024 Next Visit: 08/11/2024 Last Refill: 05/15/24  Please Advise

## 2024-08-11 ENCOUNTER — Encounter: Admitting: Family
